# Patient Record
Sex: Female | Born: 1970 | Race: White | Hispanic: No | Marital: Married | Smoking: Former smoker
Health system: Southern US, Community
[De-identification: ages and names within clinical notes are randomized; demographics above are authoritative.]

## PROBLEM LIST (undated history)

## (undated) DIAGNOSIS — F32A Depression, unspecified: Secondary | ICD-10-CM

## (undated) DIAGNOSIS — R51 Headache: Secondary | ICD-10-CM

## (undated) DIAGNOSIS — E039 Hypothyroidism, unspecified: Secondary | ICD-10-CM

## (undated) DIAGNOSIS — R0989 Other specified symptoms and signs involving the circulatory and respiratory systems: Secondary | ICD-10-CM

## (undated) DIAGNOSIS — Z86718 Personal history of other venous thrombosis and embolism: Secondary | ICD-10-CM

## (undated) DIAGNOSIS — J439 Emphysema, unspecified: Secondary | ICD-10-CM

## (undated) DIAGNOSIS — R238 Other skin changes: Secondary | ICD-10-CM

## (undated) DIAGNOSIS — I2510 Atherosclerotic heart disease of native coronary artery without angina pectoris: Secondary | ICD-10-CM

## (undated) DIAGNOSIS — E119 Type 2 diabetes mellitus without complications: Secondary | ICD-10-CM

## (undated) DIAGNOSIS — R519 Headache, unspecified: Secondary | ICD-10-CM

## (undated) DIAGNOSIS — F329 Major depressive disorder, single episode, unspecified: Secondary | ICD-10-CM

## (undated) DIAGNOSIS — R5383 Other fatigue: Secondary | ICD-10-CM

## (undated) DIAGNOSIS — D649 Anemia, unspecified: Secondary | ICD-10-CM

## (undated) DIAGNOSIS — M791 Myalgia, unspecified site: Secondary | ICD-10-CM

## (undated) DIAGNOSIS — R233 Spontaneous ecchymoses: Secondary | ICD-10-CM

## (undated) HISTORY — DX: Spontaneous ecchymoses: R23.3

## (undated) HISTORY — DX: Emphysema, unspecified: J43.9

## (undated) HISTORY — DX: Anemia, unspecified: D64.9

## (undated) HISTORY — DX: Depression, unspecified: F32.A

## (undated) HISTORY — DX: Headache, unspecified: R51.9

## (undated) HISTORY — DX: Myalgia, unspecified site: M79.10

## (undated) HISTORY — DX: Other specified symptoms and signs involving the circulatory and respiratory systems: R09.89

## (undated) HISTORY — DX: Major depressive disorder, single episode, unspecified: F32.9

## (undated) HISTORY — DX: Personal history of other venous thrombosis and embolism: Z86.718

## (undated) HISTORY — DX: Other fatigue: R53.83

## (undated) HISTORY — DX: Other skin changes: R23.8

## (undated) HISTORY — PX: HERNIA REPAIR: SHX51

## (undated) HISTORY — PX: OTHER SURGICAL HISTORY: SHX169

## (undated) HISTORY — DX: Headache: R51

---

## 1996-04-18 HISTORY — PX: ABDOMINAL HYSTERECTOMY: SHX81

## 2004-02-21 ENCOUNTER — Emergency Department: Payer: Self-pay | Admitting: Emergency Medicine

## 2004-02-23 ENCOUNTER — Other Ambulatory Visit: Payer: Self-pay

## 2004-02-23 ENCOUNTER — Emergency Department: Payer: Self-pay | Admitting: Emergency Medicine

## 2004-10-26 ENCOUNTER — Emergency Department: Payer: Self-pay | Admitting: Emergency Medicine

## 2005-03-30 DIAGNOSIS — E039 Hypothyroidism, unspecified: Secondary | ICD-10-CM | POA: Insufficient documentation

## 2005-12-08 DIAGNOSIS — R002 Palpitations: Secondary | ICD-10-CM | POA: Insufficient documentation

## 2006-03-27 DIAGNOSIS — Z720 Tobacco use: Secondary | ICD-10-CM | POA: Insufficient documentation

## 2006-03-27 DIAGNOSIS — R7989 Other specified abnormal findings of blood chemistry: Secondary | ICD-10-CM | POA: Insufficient documentation

## 2007-04-19 HISTORY — PX: DECUBITUS ULCER EXCISION: SHX1443

## 2007-07-25 ENCOUNTER — Ambulatory Visit: Payer: Self-pay | Admitting: Internal Medicine

## 2007-08-24 ENCOUNTER — Ambulatory Visit: Payer: Self-pay | Admitting: Family Medicine

## 2008-01-21 ENCOUNTER — Emergency Department: Payer: Self-pay | Admitting: Emergency Medicine

## 2008-06-26 ENCOUNTER — Emergency Department: Payer: Self-pay | Admitting: Unknown Physician Specialty

## 2008-08-16 ENCOUNTER — Ambulatory Visit: Payer: Self-pay | Admitting: Oncology

## 2008-08-21 ENCOUNTER — Ambulatory Visit: Payer: Self-pay | Admitting: Family Medicine

## 2009-02-23 ENCOUNTER — Emergency Department: Payer: Self-pay | Admitting: Emergency Medicine

## 2009-04-10 DIAGNOSIS — F32A Depression, unspecified: Secondary | ICD-10-CM | POA: Insufficient documentation

## 2009-08-16 ENCOUNTER — Ambulatory Visit: Payer: Self-pay | Admitting: Internal Medicine

## 2009-09-09 ENCOUNTER — Ambulatory Visit: Payer: Self-pay | Admitting: Internal Medicine

## 2009-09-16 ENCOUNTER — Ambulatory Visit: Payer: Self-pay | Admitting: Internal Medicine

## 2009-10-16 ENCOUNTER — Ambulatory Visit: Payer: Self-pay | Admitting: Internal Medicine

## 2011-06-21 ENCOUNTER — Ambulatory Visit: Payer: Self-pay | Admitting: Family Medicine

## 2012-12-18 ENCOUNTER — Inpatient Hospital Stay: Payer: Self-pay | Admitting: Internal Medicine

## 2012-12-18 LAB — COMPREHENSIVE METABOLIC PANEL
BUN: 15 mg/dL (ref 7–18)
Calcium, Total: 8.9 mg/dL (ref 8.5–10.1)
Chloride: 88 mmol/L — ABNORMAL LOW (ref 98–107)
Co2: 34 mmol/L — ABNORMAL HIGH (ref 21–32)
EGFR (African American): >60
Glucose: 676 mg/dL (ref 65–99)
Osmolality: 296 (ref 275–301)
Potassium: 3.3 mmol/L — ABNORMAL LOW (ref 3.5–5.1)
SGOT(AST): 29 U/L (ref 15–37)
SGPT (ALT): 28 U/L (ref 12–78)
Sodium: 131 mmol/L — ABNORMAL LOW (ref 136–145)

## 2012-12-18 LAB — DIFFERENTIAL
Bands: 6 %
Monocytes: 3 %
Segmented Neutrophils: 86 %

## 2012-12-18 LAB — CBC
HCT: 37 % (ref 35.0–47.0)
MCHC: 30.7 g/dL — ABNORMAL LOW (ref 32.0–36.0)
MCV: 67 fL — ABNORMAL LOW (ref 80–100)
Platelet: 90 10*3/uL — ABNORMAL LOW (ref 150–440)
RBC: 5.51 10*6/uL — ABNORMAL HIGH (ref 3.80–5.20)
RDW: 16 % — ABNORMAL HIGH (ref 11.5–14.5)

## 2012-12-18 LAB — URINALYSIS, COMPLETE
Bacteria: NONE SEEN
Bilirubin,UR: NEGATIVE
Glucose,UR: >=500 mg/dL (ref 0–75)
Leukocyte Esterase: NEGATIVE
Protein: 30
WBC UR: <1 /HPF (ref 0–5)

## 2012-12-18 LAB — HEMOGLOBIN A1C: Hemoglobin A1C: 11.8 % — ABNORMAL HIGH (ref 4.2–6.3)

## 2012-12-18 LAB — LIPASE, BLOOD: Lipase: 179 U/L (ref 73–393)

## 2012-12-18 LAB — MAGNESIUM: Magnesium: 1.6 mg/dL — ABNORMAL LOW

## 2012-12-19 LAB — BASIC METABOLIC PANEL
Anion Gap: 7 (ref 7–16)
Calcium, Total: 8.4 mg/dL — ABNORMAL LOW (ref 8.5–10.1)
Chloride: 103 mmol/L (ref 98–107)
Co2: 28 mmol/L (ref 21–32)
Creatinine: 0.4 mg/dL — ABNORMAL LOW (ref 0.60–1.30)
EGFR (African American): >60
EGFR (Non-African Amer.): >60
Glucose: 240 mg/dL — ABNORMAL HIGH (ref 65–99)
Osmolality: 281 (ref 275–301)
Potassium: 3.4 mmol/L — ABNORMAL LOW (ref 3.5–5.1)
Sodium: 138 mmol/L (ref 136–145)

## 2012-12-19 LAB — URINE CULTURE

## 2012-12-19 LAB — CBC WITH DIFFERENTIAL/PLATELET
Basophil #: 0 10*3/uL (ref 0.0–0.1)
Basophil %: 0.3 %
HGB: 10.4 g/dL — ABNORMAL LOW (ref 12.0–16.0)
Lymphocyte %: 17.8 %
MCV: 65 fL — ABNORMAL LOW (ref 80–100)
Monocyte #: 0.2 x10 3/mm (ref 0.2–0.9)
Monocyte %: 4.2 %
Neutrophil #: 2.9 10*3/uL (ref 1.4–6.5)
RBC: 5 10*6/uL (ref 3.80–5.20)
RDW: 16 % — ABNORMAL HIGH (ref 11.5–14.5)
WBC: 3.7 10*3/uL (ref 3.6–11.0)

## 2012-12-19 LAB — MAGNESIUM: Magnesium: 1.4 mg/dL — ABNORMAL LOW

## 2012-12-20 LAB — CBC WITH DIFFERENTIAL/PLATELET
Basophil #: 0 10*3/uL (ref 0.0–0.1)
Eosinophil #: 0 10*3/uL (ref 0.0–0.7)
Lymphocyte #: 1.1 10*3/uL (ref 1.0–3.6)
Lymphocyte %: 34.9 %
MCH: 20.9 pg — ABNORMAL LOW (ref 26.0–34.0)
MCV: 65 fL — ABNORMAL LOW (ref 80–100)
Monocyte %: 7.8 %
Neutrophil %: 56.1 %
Platelet: 64 10*3/uL — ABNORMAL LOW (ref 150–440)
RDW: 16.1 % — ABNORMAL HIGH (ref 11.5–14.5)

## 2012-12-20 LAB — BASIC METABOLIC PANEL
Calcium, Total: 8.3 mg/dL — ABNORMAL LOW (ref 8.5–10.1)
Co2: 28 mmol/L (ref 21–32)
EGFR (African American): >60
Potassium: 4 mmol/L (ref 3.5–5.1)
Sodium: 141 mmol/L (ref 136–145)

## 2012-12-21 LAB — CBC WITH DIFFERENTIAL/PLATELET
HCT: 31.9 % — ABNORMAL LOW (ref 35.0–47.0)
Lymphocyte #: 1.4 10*3/uL (ref 1.0–3.6)
Lymphocyte %: 37.9 %
MCH: 20.6 pg — ABNORMAL LOW (ref 26.0–34.0)
MCV: 65 fL — ABNORMAL LOW (ref 80–100)
Monocyte #: 0.3 x10 3/mm (ref 0.2–0.9)
Monocyte %: 8.1 %
Neutrophil #: 1.9 10*3/uL (ref 1.4–6.5)
Neutrophil %: 51.7 %
RDW: 16.1 % — ABNORMAL HIGH (ref 11.5–14.5)
WBC: 3.7 10*3/uL (ref 3.6–11.0)

## 2012-12-21 LAB — BASIC METABOLIC PANEL
Anion Gap: 4 — ABNORMAL LOW (ref 7–16)
BUN: 7 mg/dL (ref 7–18)
Calcium, Total: 8.2 mg/dL — ABNORMAL LOW (ref 8.5–10.1)
Chloride: 110 mmol/L — ABNORMAL HIGH (ref 98–107)
EGFR (African American): >60
Glucose: 102 mg/dL — ABNORMAL HIGH (ref 65–99)
Osmolality: 281 (ref 275–301)
Potassium: 4.1 mmol/L (ref 3.5–5.1)
Sodium: 142 mmol/L (ref 136–145)

## 2012-12-23 LAB — CULTURE, BLOOD (SINGLE)

## 2013-02-11 ENCOUNTER — Ambulatory Visit: Payer: Self-pay | Admitting: Podiatry

## 2013-03-02 ENCOUNTER — Emergency Department: Payer: Self-pay | Admitting: Emergency Medicine

## 2013-03-03 LAB — PROTIME-INR
INR: 1
Prothrombin Time: 13 secs (ref 11.5–14.7)

## 2013-03-03 LAB — CBC
HCT: 37.3 % (ref 35.0–47.0)
MCH: 20 pg — ABNORMAL LOW (ref 26.0–34.0)
MCHC: 31.3 g/dL — ABNORMAL LOW (ref 32.0–36.0)
MCV: 64 fL — ABNORMAL LOW (ref 80–100)
RBC: 5.83 10*6/uL — ABNORMAL HIGH (ref 3.80–5.20)
RDW: 17 % — ABNORMAL HIGH (ref 11.5–14.5)
WBC: 5.8 10*3/uL (ref 3.6–11.0)

## 2013-05-20 ENCOUNTER — Observation Stay: Payer: Self-pay | Admitting: Student

## 2013-05-20 ENCOUNTER — Telehealth: Payer: Self-pay | Admitting: *Deleted

## 2013-05-20 LAB — CBC WITH DIFFERENTIAL/PLATELET
BASOS ABS: 0 10*3/uL (ref 0.0–0.1)
BASOS PCT: 1 %
EOS ABS: 0.1 10*3/uL (ref 0.0–0.7)
Eosinophil %: 2.5 %
HCT: 34.9 % — ABNORMAL LOW (ref 35.0–47.0)
HGB: 10.8 g/dL — ABNORMAL LOW (ref 12.0–16.0)
LYMPHS ABS: 1.2 10*3/uL (ref 1.0–3.6)
Lymphocyte %: 25.1 %
MCH: 20.7 pg — ABNORMAL LOW (ref 26.0–34.0)
MCHC: 30.9 g/dL — ABNORMAL LOW (ref 32.0–36.0)
MCV: 67 fL — ABNORMAL LOW (ref 80–100)
MONOS PCT: 7.3 %
Monocyte #: 0.3 x10 3/mm (ref 0.2–0.9)
NEUTROS PCT: 64.1 %
Neutrophil #: 3 10*3/uL (ref 1.4–6.5)
PLATELETS: 115 10*3/uL — AB (ref 150–440)
RBC: 5.2 10*6/uL (ref 3.80–5.20)
RDW: 17.9 % — AB (ref 11.5–14.5)
WBC: 4.8 10*3/uL (ref 3.6–11.0)

## 2013-05-20 LAB — COMPREHENSIVE METABOLIC PANEL
ALK PHOS: 143 U/L — AB
ANION GAP: 4 — AB (ref 7–16)
Albumin: 3.3 g/dL — ABNORMAL LOW (ref 3.4–5.0)
BUN: 10 mg/dL (ref 7–18)
Bilirubin,Total: 0.3 mg/dL (ref 0.2–1.0)
CO2: 36 mmol/L — AB (ref 21–32)
CREATININE: 0.92 mg/dL (ref 0.60–1.30)
Calcium, Total: 8.9 mg/dL (ref 8.5–10.1)
Chloride: 92 mmol/L — ABNORMAL LOW (ref 98–107)
EGFR (Non-African Amer.): >60
Glucose: 397 mg/dL — ABNORMAL HIGH (ref 65–99)
Osmolality: 280 (ref 275–301)
Potassium: 3.7 mmol/L (ref 3.5–5.1)
SGOT(AST): 14 U/L — ABNORMAL LOW (ref 15–37)
SGPT (ALT): 16 U/L (ref 12–78)
SODIUM: 132 mmol/L — AB (ref 136–145)
TOTAL PROTEIN: 6.9 g/dL (ref 6.4–8.2)

## 2013-05-20 NOTE — Telephone Encounter (Signed)
Pt called and said her left foot under the great toe, a callus, was infected and had a really bad odor. York SpanielSaid it was open, oozing, and painful and has been going on since Friday 1.30.15. Per dr Al Corpushyatt pt is to go to the emergency room. Pt did not like answer and i had to tell her multiple times per dr Al Corpushyatt go to ER. Pt finally said ok and understood

## 2013-05-25 LAB — CULTURE, BLOOD (SINGLE)

## 2013-05-30 ENCOUNTER — Ambulatory Visit: Payer: Self-pay | Admitting: Podiatry

## 2013-06-04 ENCOUNTER — Ambulatory Visit: Payer: Self-pay | Admitting: Podiatry

## 2013-06-20 ENCOUNTER — Encounter: Payer: Self-pay | Admitting: Podiatry

## 2013-06-21 ENCOUNTER — Ambulatory Visit (INDEPENDENT_AMBULATORY_CARE_PROVIDER_SITE_OTHER): Payer: Medicare PPO | Admitting: Podiatry

## 2013-06-21 ENCOUNTER — Encounter: Payer: Self-pay | Admitting: Podiatry

## 2013-06-21 VITALS — BP 101/70 | HR 110 | Resp 16 | Ht 61.0 in | Wt 134.0 lb

## 2013-06-21 DIAGNOSIS — E1159 Type 2 diabetes mellitus with other circulatory complications: Secondary | ICD-10-CM

## 2013-06-21 DIAGNOSIS — Q828 Other specified congenital malformations of skin: Secondary | ICD-10-CM

## 2013-06-21 DIAGNOSIS — M79609 Pain in unspecified limb: Secondary | ICD-10-CM

## 2013-06-21 DIAGNOSIS — B351 Tinea unguium: Secondary | ICD-10-CM

## 2013-06-21 NOTE — Progress Notes (Signed)
Subjective:     Patient ID: [name] [name], female   DOB: [birth year], 43 y.o.   MRN: [id number]  HPI patient presents stating I have these lesions underneath my first metatarsal to both feet and my nailbeds are thick and gets sore and making hard for me to wear shoe gear   Review of Systems     Objective:   Physical Exam Neurovascular status unchanged with thick painful nail bed 1-5 both feet and severe keratotic lesion sub-first metatarsal both feet and at risk diabetic who is not in good control and has vascular and neurological issues. No proximal edema erythema or drainage was noted    Assessment:     At risk diabetic with severe keratotic lesion sub-one of both feet and nail disease with pain 1-5 both feet    Plan:     Debridement of painful nail bed 1-5 both feet with no iatrogenic bleeding and debridement of lesions on both first metatarsal with some bleeding with application Neosporin and dressings and instructions on soaks and if any redness or swelling to occur to reappoint immediately. Patient will receive permission for diabetic shoes as I do think they'll be of great benefit to her

## 2013-07-29 ENCOUNTER — Emergency Department: Payer: Self-pay | Admitting: Emergency Medicine

## 2013-07-29 LAB — COMPREHENSIVE METABOLIC PANEL
ALK PHOS: 174 U/L — AB
AST: 28 U/L (ref 15–37)
Albumin: 3.3 g/dL — ABNORMAL LOW (ref 3.4–5.0)
Anion Gap: 6 — ABNORMAL LOW (ref 7–16)
BUN: 10 mg/dL (ref 7–18)
Bilirubin,Total: 0.4 mg/dL (ref 0.2–1.0)
CREATININE: 0.64 mg/dL (ref 0.60–1.30)
Calcium, Total: 8.5 mg/dL (ref 8.5–10.1)
Chloride: 101 mmol/L (ref 98–107)
Co2: 32 mmol/L (ref 21–32)
EGFR (African American): >60
GLUCOSE: 257 mg/dL — AB (ref 65–99)
Osmolality: 285 (ref 275–301)
POTASSIUM: 3.6 mmol/L (ref 3.5–5.1)
SGPT (ALT): 31 U/L (ref 12–78)
Sodium: 139 mmol/L (ref 136–145)
TOTAL PROTEIN: 7.1 g/dL (ref 6.4–8.2)

## 2013-07-29 LAB — TROPONIN I

## 2013-07-29 LAB — LIPASE, BLOOD: LIPASE: 230 U/L (ref 73–393)

## 2013-07-30 ENCOUNTER — Ambulatory Visit: Payer: Medicare PPO | Admitting: Podiatry

## 2013-07-30 LAB — CBC WITH DIFFERENTIAL/PLATELET
BASOS ABS: 0 10*3/uL (ref 0.0–0.1)
Basophil %: 0.7 %
Eosinophil #: 0.1 10*3/uL (ref 0.0–0.7)
Eosinophil %: 1.8 %
HCT: 34.4 % — AB (ref 35.0–47.0)
HGB: 10.5 g/dL — AB (ref 12.0–16.0)
Lymphocyte #: 0.6 10*3/uL — ABNORMAL LOW (ref 1.0–3.6)
Lymphocyte %: 16.7 %
MCH: 19.6 pg — ABNORMAL LOW (ref 26.0–34.0)
MCHC: 30.6 g/dL — ABNORMAL LOW (ref 32.0–36.0)
MCV: 64 fL — ABNORMAL LOW (ref 80–100)
Monocyte #: 0.2 x10 3/mm (ref 0.2–0.9)
Monocyte %: 5.7 %
Neutrophil #: 2.7 10*3/uL (ref 1.4–6.5)
Neutrophil %: 75.1 %
Platelet: 94 10*3/uL — ABNORMAL LOW (ref 150–440)
RBC: 5.38 10*6/uL — AB (ref 3.80–5.20)
RDW: 19.4 % — AB (ref 11.5–14.5)
WBC: 3.6 10*3/uL (ref 3.6–11.0)

## 2013-07-30 LAB — URINALYSIS, COMPLETE
Bilirubin,UR: NEGATIVE
KETONE: NEGATIVE
Leukocyte Esterase: NEGATIVE
NITRITE: NEGATIVE
PH: 7 (ref 4.5–8.0)
Protein: 100
RBC,UR: 7 /HPF (ref 0–5)
Specific Gravity: 1.013 (ref 1.003–1.030)
WBC UR: 1 /HPF (ref 0–5)

## 2013-07-30 LAB — AMMONIA: AMMONIA, PLASMA: 14 umol/L (ref 11–32)

## 2013-08-01 LAB — COMPREHENSIVE METABOLIC PANEL
AST: 31 U/L (ref 15–37)
Albumin: 3.4 g/dL (ref 3.4–5.0)
Alkaline Phosphatase: 150 U/L — ABNORMAL HIGH
Anion Gap: 8 (ref 7–16)
BILIRUBIN TOTAL: 0.5 mg/dL (ref 0.2–1.0)
BUN: 20 mg/dL — ABNORMAL HIGH (ref 7–18)
CREATININE: 0.82 mg/dL (ref 0.60–1.30)
Calcium, Total: 8.5 mg/dL (ref 8.5–10.1)
Chloride: 93 mmol/L — ABNORMAL LOW (ref 98–107)
Co2: 28 mmol/L (ref 21–32)
EGFR (African American): >60
EGFR (Non-African Amer.): >60
GLUCOSE: 393 mg/dL — AB (ref 65–99)
Osmolality: 278 (ref 275–301)
Potassium: 3.6 mmol/L (ref 3.5–5.1)
SGPT (ALT): 27 U/L (ref 12–78)
Sodium: 129 mmol/L — ABNORMAL LOW (ref 136–145)
TOTAL PROTEIN: 7.5 g/dL (ref 6.4–8.2)

## 2013-08-01 LAB — CBC WITH DIFFERENTIAL/PLATELET
Basophil #: 0 10*3/uL (ref 0.0–0.1)
Basophil %: 0.5 %
EOS ABS: 0 10*3/uL (ref 0.0–0.7)
EOS PCT: 0 %
HCT: 38.8 % (ref 35.0–47.0)
HGB: 12 g/dL (ref 12.0–16.0)
Lymphocyte #: 0.8 10*3/uL — ABNORMAL LOW (ref 1.0–3.6)
Lymphocyte %: 13.1 %
MCH: 19.4 pg — ABNORMAL LOW (ref 26.0–34.0)
MCHC: 30.9 g/dL — ABNORMAL LOW (ref 32.0–36.0)
MCV: 63 fL — ABNORMAL LOW (ref 80–100)
Monocyte #: 0.3 x10 3/mm (ref 0.2–0.9)
Monocyte %: 5.3 %
NEUTROS ABS: 5 10*3/uL (ref 1.4–6.5)
NEUTROS PCT: 81.1 %
Platelet: 137 10*3/uL — ABNORMAL LOW (ref 150–440)
RBC: 6.18 10*6/uL — ABNORMAL HIGH (ref 3.80–5.20)
RDW: 19.2 % — AB (ref 11.5–14.5)
WBC: 6.2 10*3/uL (ref 3.6–11.0)

## 2013-08-01 LAB — URINALYSIS, COMPLETE
Bacteria: NONE SEEN
Bilirubin,UR: NEGATIVE
Glucose,UR: >=500 mg/dL (ref 0–75)
LEUKOCYTE ESTERASE: NEGATIVE
Nitrite: NEGATIVE
Ph: 6 (ref 4.5–8.0)
Protein: >=500
Specific Gravity: 1.036 (ref 1.003–1.030)
Squamous Epithelial: NONE SEEN
WBC UR: NONE SEEN /HPF (ref 0–5)

## 2013-08-01 LAB — LIPASE, BLOOD: Lipase: 237 U/L (ref 73–393)

## 2013-08-02 LAB — BASIC METABOLIC PANEL
Anion Gap: 6 — ABNORMAL LOW (ref 7–16)
BUN: 11 mg/dL (ref 7–18)
CALCIUM: 8.4 mg/dL — AB (ref 8.5–10.1)
CO2: 30 mmol/L (ref 21–32)
Chloride: 97 mmol/L — ABNORMAL LOW (ref 98–107)
Creatinine: 0.71 mg/dL (ref 0.60–1.30)
EGFR (African American): >60
EGFR (Non-African Amer.): >60
Glucose: 202 mg/dL — ABNORMAL HIGH (ref 65–99)
Osmolality: 272 (ref 275–301)
Potassium: 3.3 mmol/L — ABNORMAL LOW (ref 3.5–5.1)
SODIUM: 133 mmol/L — AB (ref 136–145)

## 2013-08-02 LAB — CBC WITH DIFFERENTIAL/PLATELET
BASOS PCT: 0.4 %
Basophil #: 0 10*3/uL (ref 0.0–0.1)
EOS ABS: 0 10*3/uL (ref 0.0–0.7)
EOS PCT: 0.1 %
HCT: 34.8 % — AB (ref 35.0–47.0)
HGB: 11 g/dL — ABNORMAL LOW (ref 12.0–16.0)
LYMPHS ABS: 0.8 10*3/uL — AB (ref 1.0–3.6)
LYMPHS PCT: 18.3 %
MCH: 19.7 pg — ABNORMAL LOW (ref 26.0–34.0)
MCHC: 31.5 g/dL — ABNORMAL LOW (ref 32.0–36.0)
MCV: 62 fL — AB (ref 80–100)
Monocyte #: 0.4 x10 3/mm (ref 0.2–0.9)
Monocyte %: 7.9 %
NEUTROS ABS: 3.3 10*3/uL (ref 1.4–6.5)
Neutrophil %: 73.3 %
Platelet: 118 10*3/uL — ABNORMAL LOW (ref 150–440)
RBC: 5.58 10*6/uL — ABNORMAL HIGH (ref 3.80–5.20)
RDW: 19.2 % — ABNORMAL HIGH (ref 11.5–14.5)
WBC: 4.6 10*3/uL (ref 3.6–11.0)

## 2013-08-02 LAB — LIPID PANEL
CHOLESTEROL: 290 mg/dL — AB (ref 0–200)
HDL Cholesterol: 33 mg/dL — ABNORMAL LOW (ref 40–60)
LDL CHOLESTEROL, CALC: 205 mg/dL — AB (ref 0–100)
Triglycerides: 258 mg/dL — ABNORMAL HIGH (ref 0–200)
VLDL Cholesterol, Calc: 52 mg/dL — ABNORMAL HIGH (ref 5–40)

## 2013-08-02 LAB — TSH: THYROID STIMULATING HORM: 50.3 u[IU]/mL — AB

## 2013-08-03 LAB — BASIC METABOLIC PANEL
ANION GAP: 6 — AB (ref 7–16)
BUN: 9 mg/dL (ref 7–18)
CREATININE: 0.73 mg/dL (ref 0.60–1.30)
Calcium, Total: 8 mg/dL — ABNORMAL LOW (ref 8.5–10.1)
Chloride: 102 mmol/L (ref 98–107)
Co2: 28 mmol/L (ref 21–32)
EGFR (Non-African Amer.): >60
Glucose: 136 mg/dL — ABNORMAL HIGH (ref 65–99)
Osmolality: 273 (ref 275–301)
Potassium: 3.8 mmol/L (ref 3.5–5.1)
SODIUM: 136 mmol/L (ref 136–145)

## 2013-08-04 ENCOUNTER — Inpatient Hospital Stay: Payer: Self-pay | Admitting: Internal Medicine

## 2013-08-07 LAB — PATHOLOGY REPORT

## 2013-10-04 ENCOUNTER — Ambulatory Visit: Payer: Self-pay | Admitting: Family Medicine

## 2013-11-05 ENCOUNTER — Ambulatory Visit (INDEPENDENT_AMBULATORY_CARE_PROVIDER_SITE_OTHER): Payer: Medicare PPO | Admitting: Podiatry

## 2013-11-05 VITALS — BP 107/74 | HR 108 | Resp 16

## 2013-11-05 DIAGNOSIS — Q828 Other specified congenital malformations of skin: Secondary | ICD-10-CM

## 2013-11-05 DIAGNOSIS — B351 Tinea unguium: Secondary | ICD-10-CM

## 2013-11-05 DIAGNOSIS — E1159 Type 2 diabetes mellitus with other circulatory complications: Secondary | ICD-10-CM

## 2013-11-05 DIAGNOSIS — M79609 Pain in unspecified limb: Secondary | ICD-10-CM

## 2013-11-05 DIAGNOSIS — M79673 Pain in unspecified foot: Secondary | ICD-10-CM

## 2013-11-05 NOTE — Progress Notes (Signed)
Subjective:     Patient ID: [name] [name], female   DOB: [birth year], 43 y.o.   MRN: [id number]  HPI patient is long-term diabetic with severe lesion underneath the first metatarsal both feet and nail disease with thickness and elongation 1-5 of both feet.   Review of Systems     Objective:   Physical Exam Neurovascular status unchanged with diminishment of sharp tall and vibratory and severe keratotic lesions underneath the right and left first metatarsal with dried blood formation and   elongated nailbeds that are thick yellow and brittle 1-5 of both feet that are painful Assessment:     At risk diabetic with severe keratotic lesions plantar aspect of right foot and left foot and nail disease 1-5 both feet    Plan:     Debrided lesions on both feet and debridement nailbeds 1-5 both feet with no iatrogenic bleeding noted. Discussed long-term diabetic shoes and will get her approved for these due to her at risk situation

## 2013-11-26 ENCOUNTER — Telehealth: Payer: Self-pay | Admitting: *Deleted

## 2013-11-26 NOTE — Telephone Encounter (Signed)
CALLED AND SPOKE WITH PTS HUSBAND LETTING HIM KNOW WE NEED TO SCH PT TO COME IN AND BE MEASURED FOR DIABETIC SHOES. STATES HE WILL CALL VIVIANA AND HAVE HER TO CALL BACK TO SCHEDULE APPT.

## 2013-12-13 ENCOUNTER — Telehealth: Payer: Self-pay | Admitting: *Deleted

## 2013-12-13 ENCOUNTER — Ambulatory Visit (INDEPENDENT_AMBULATORY_CARE_PROVIDER_SITE_OTHER): Payer: Medicare PPO | Admitting: Podiatry

## 2013-12-13 VITALS — BP 133/79 | HR 100 | Resp 16

## 2013-12-13 DIAGNOSIS — M79673 Pain in unspecified foot: Secondary | ICD-10-CM

## 2013-12-13 DIAGNOSIS — M79609 Pain in unspecified limb: Secondary | ICD-10-CM

## 2013-12-13 DIAGNOSIS — Q828 Other specified congenital malformations of skin: Secondary | ICD-10-CM

## 2013-12-13 DIAGNOSIS — E1159 Type 2 diabetes mellitus with other circulatory complications: Secondary | ICD-10-CM

## 2013-12-13 NOTE — Progress Notes (Signed)
Subjective:     Patient ID: [name] [name], female   DOB: 09/18/1970, 43 y.o.   MRN: [id number]  HPI patient presents with husband in very poor health with large thick keratotic lesions underneath the first metatarsal head of both feet which can become very sore. She is a long-term diabetic and is in poor control and has numerous health problems   Review of Systems     Objective:   Physical Exam Neurovascular status unchanged with thick type tissue underneath the first metatarsal head that grew back very quickly with obvious signs of bleeding which occurs underneath the lesions    Assessment:     Very difficult to determine what type of lesions are present but concerning that it becomes so thick and damage    Plan:     I anesthetized the left forefoot debris did tissue and then did a 3 mm punch biopsy that I send off for pathology to try to understand better the tissue. Debris did tissue on both feet and applied sterile dressings and then went ahead and measured for diabetic shoes to try to reduce pressure against her feet

## 2013-12-13 NOTE — Telephone Encounter (Signed)
CALLED AND LEFT MESSAGE STATING [employer] NO LONGER HAD THE SHOE SHE HAD PICKED OUT STRETCHABLE [employer] #[id number]. THEY DO HAVE THE SAME SHOE IN BLACK ORDER [phone]. ASKED PT TO CALL ME BACK TO LET ME KNOW IF SHE WANTS THE SHOE OR COME BACK IN TO PICK OUT A DIFFERENT SHOE.

## 2013-12-20 ENCOUNTER — Encounter: Payer: Self-pay | Admitting: Podiatry

## 2013-12-25 ENCOUNTER — Telehealth: Payer: Self-pay | Admitting: *Deleted

## 2013-12-25 NOTE — Telephone Encounter (Signed)
Called and left message asking pt if she would call back to see if she would like the black pair of shoes since they no longer carry tan color

## 2013-12-26 ENCOUNTER — Ambulatory Visit (INDEPENDENT_AMBULATORY_CARE_PROVIDER_SITE_OTHER): Payer: Medicare PPO | Admitting: Podiatry

## 2013-12-26 VITALS — BP 99/61 | HR 105 | Resp 16

## 2013-12-26 DIAGNOSIS — Q828 Other specified congenital malformations of skin: Secondary | ICD-10-CM

## 2013-12-26 DIAGNOSIS — M79609 Pain in unspecified limb: Secondary | ICD-10-CM

## 2013-12-26 DIAGNOSIS — L97509 Non-pressure chronic ulcer of other part of unspecified foot with unspecified severity: Secondary | ICD-10-CM

## 2013-12-26 DIAGNOSIS — M79673 Pain in unspecified foot: Secondary | ICD-10-CM

## 2013-12-26 MED ORDER — DOXYCYCLINE HYCLATE 100 MG PO TABS: 100.0000 mg | ORAL_TABLET | Freq: Two times a day (BID) | ORAL | Status: DC

## 2013-12-26 MED ORDER — SILVER SULFADIAZINE 1 % EX CREA: TOPICAL_CREAM | CUTANEOUS | Status: DC

## 2013-12-26 NOTE — Patient Instructions (Signed)
Monitor for any signs/symptoms of infection. Call the office immediately if any occur or go directly to the emergency room. Call with any questions/concerns. Continue daily dressing changes 

## 2013-12-27 ENCOUNTER — Telehealth: Payer: Self-pay | Admitting: *Deleted

## 2013-12-27 NOTE — Telephone Encounter (Signed)
Called and spoke with pts husband letting him know [name] [name] has an appt with [facility] on 9.16.15 at 9:30 am. Pts husband understood.

## 2013-12-28 NOTE — Progress Notes (Signed)
Patient ID: [name] [name], female   DOB: April 07, 1971, 43 y.o.   MRN: [id number]  Subjective: [name] [name] returns the office they with her husband for followup evaluation of bilateral foot wounds. She states that since her last appointment she believes that the wounds have been getting worse. She has since noticed a malodor coming from her feet in the are sore. They have been continuing with daily dressing changes. She previously had had some nausea but currently she denies any systemic complaints such as fevers, chills, nausea, vomiting. She states that her feet feel chronically cool. This is not an acute change. Denies any other complaints at this time.  Objective: AAO x3, NAD DP/PT pulses palpable. CRT < 3sec Decreased protective sensation with Dorann Ou monofilament. Left foot: Submetatarsal one with very thick hyperkeratotic tissue with underlying macerated tissue and fibro-granular ulceration. There is no surrounding erythema, ascending cellulitis, warmth there is no probing to bone, undermining, tunneling. No purulence Hemorrhagic area on the lateral aspect of the foot with no ulceration. Right foot: Very thick submetatarsal 1 hyperkeratotic tissue  with underlying macerated tissue. There is an underlying fibro-granular wound which does not probe, undermining or tunneling. No purulence.There is epiduralysis and superficial ulceration along the dorsal medial aspect of the foot. Hemorrhagic area on the lateral aspect of the foot with no open lesion but appears to be slightly bulla starting to form. There is mild edema and a slight warmth to the right foot. There is no ascending cellulitis. There is no fluctuance or crepitus.  No leg pain, swelling, warmth.  Assessment: 43 year old female with bilateral foot ulceration with possible mild infection to the right foot.  Plan: -Surgical versus conservative treatment discussed with the patient and her family in detail including  alternatives, risks, complications. -Bilateral submetatarsal wounds sharply debrided without complications of hyperkeratotic tissue.  -Ordered ABIs and PVRs -Silvadene to the wounds with daily dressing changes. Discussed the importance of not applying the dressing too tight as that may be contributing to the hemorrhagic areas on the lateral aspect of the feet bilaterally. -Prescribed doxycycline 100 mg twice a day. -Biopsy results from her last appointment were discussed with the patient. Discussed that we can do a deeper biopsy. They are to hold off at this time. We'll reconsider in the near future if wounds do not heal.  -Followup in 1 week or sooner if any problems are to arise or any change in symptoms. Discussed that if there is increased warmth, drainage, malodor or any other clinical signs or symptoms of infection she is directed to go immediately to the emergency room.

## 2014-01-02 ENCOUNTER — Telehealth: Payer: Self-pay | Admitting: *Deleted

## 2014-01-02 ENCOUNTER — Ambulatory Visit (INDEPENDENT_AMBULATORY_CARE_PROVIDER_SITE_OTHER): Payer: Medicare PPO

## 2014-01-02 ENCOUNTER — Ambulatory Visit (INDEPENDENT_AMBULATORY_CARE_PROVIDER_SITE_OTHER): Payer: Medicare PPO | Admitting: Podiatry

## 2014-01-02 VITALS — BP 110/79 | HR 105 | Resp 16

## 2014-01-02 DIAGNOSIS — L97509 Non-pressure chronic ulcer of other part of unspecified foot with unspecified severity: Secondary | ICD-10-CM

## 2014-01-02 DIAGNOSIS — M79673 Pain in unspecified foot: Secondary | ICD-10-CM

## 2014-01-02 MED ORDER — SILVER SULFADIAZINE 1 % EX CREA: TOPICAL_CREAM | CUTANEOUS | Status: DC

## 2014-01-02 NOTE — Telephone Encounter (Signed)
Called and spoke with pts husband letting him know appt with [facility] is sch 10.5.15 at 10:00 with dr schnier. pts husband understood.

## 2014-01-02 NOTE — Patient Instructions (Signed)
Continue with daily dressing changes.  We will put in a referral for vascular surgery to evaluate your blood flow. Monitor for any signs/symptoms of infection. Call the office immediately if any occur or go directly to the emergency room. Call with any questions/concerns.

## 2014-01-03 NOTE — Progress Notes (Signed)
Patient ID: [name] [name], female   DOB: [birth year], 43 y.o.   MRN: [id number]  Subjective: [name] [name], 43 year old female, presents the office today with her husband for followup evaluation of bilateral foot wounds. She has not started taking antibiotic as directed she was concerned about possible interactions with her other medications. States that the malodor has decreased over the wounds persists. He been applying Silvadene and a daily dressing. She states that she went to have the vascular studies done on Wednesday and she was told that she had decreased blood flow to her left side and that she may need for further study (I have not yet received the results). Currently she denies any nausea, vomiting, fevers, chills.   Objective: AAO x3, NAD Neurovascular status unchanged. Left foot: Submetatarsal 1 hyperkeratotic lesion with underlying macerated tissue although decreased from last week. There is a fibro-granular underlying ulceration. There is a black discoloration to the hyperkerotic tissue, likely from dried blood. There is no probing, an undermining, tunneling. No malodor or purulence.  Right foot: Submetatarsal 1 hyperkerotic lesion with some underlying macerated tissue with the callus area soft and partially disconnected from the underlying skin. Upon debridement, Small fibro-granular ulcerations. There is also a superficial, granular ulceration along the dorsal medial aspect of the forefoot. No probing, undermining, tunneling. No purulence, malodor. There is mild erythema around both wounds, however no ascending cellulitis, no fluctuance or crepitance. Flaccid  hemorrhagic bulla on the lateral aspect of the foot. No surrounding erythema, ascending cellulitis.  No leg pain, warmth, swelling.  Assessment: 43 year old female with bilateral foot ulceration, mild erythema to right foot.  Plan: -X-rays were obtained and reviewed with the patient. See x-ray report for full details.   -Conservative versus surgical treatment were discussed including alternatives, risks, complications. -Hyperkeratotic lesions/wound sharply debrided bilateral feet without complications. 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 -Continue with daily dressing changes with Silvadene. -Start taking the doxycycline as prescribed last week. [place] the patients pharmacy to verify that there is no interactions with her other medications. -Will order home nursing supplies to be sent the house -Followup in one week for further debridement. If wounds do not appear to be making any progress we will refer the patient to the wound care center. In the meantime call any questions or concerns or change in symptoms.

## 2014-01-09 ENCOUNTER — Ambulatory Visit (INDEPENDENT_AMBULATORY_CARE_PROVIDER_SITE_OTHER): Payer: Medicare PPO | Admitting: Podiatry

## 2014-01-09 VITALS — BP 106/72 | HR 103 | Resp 16

## 2014-01-09 DIAGNOSIS — L97509 Non-pressure chronic ulcer of other part of unspecified foot with unspecified severity: Secondary | ICD-10-CM

## 2014-01-09 DIAGNOSIS — M79673 Pain in unspecified foot: Secondary | ICD-10-CM

## 2014-01-09 NOTE — Patient Instructions (Signed)
Continue antibiotic. Monitor for any signs/symptoms of infection. Call the office immediately if any occur or go directly to the emergency room. Call with any questions/concerns. Follow up with vascular surgery and the wound care center.

## 2014-01-10 ENCOUNTER — Telehealth: Payer: Self-pay | Admitting: *Deleted

## 2014-01-10 DIAGNOSIS — L97509 Non-pressure chronic ulcer of other part of unspecified foot with unspecified severity: Secondary | ICD-10-CM | POA: Insufficient documentation

## 2014-01-10 NOTE — Progress Notes (Signed)
Patient ID: [name] [name], female   DOB: 06-01-1970, 43 y.o.   MRN: [id number]  Subjective: [name] [name], 43 year old female, returns to the office today with her husband for followup evaluation of bilateral foot wounds. She states that she started taking antibiotic. She denies any systemic complaints as fevers, chills, nausea, vomiting. She states that the malodor has resolved. She's been continuing with the Silvadene daily dressing changes. She has an appointment to see vascular surgery in approximately one week. Denies any acute changes since last appointment. No other complaints at this time.  Objective: AAO x3, NAD DP/PT pulses palpable b/l. CRT < 3sec Decreased protective sensation with Dorann Ou monofilament. Left foot: Submetatarsal 1 hyperkeratotic lesion with over this slight underlying macerated. Hyperkeratotic tissue appears to be less compared to last week. There is evidence of dried blood within the hyperkeratotic tissue. Upon debridement there is a superficial small ulceration without any probing/undermining/tunneling. There is no swelling erythema, ascending cellulitis, drainage, purulence. Dried hemorrhagic bulla on the lateral aspect of the foot without any evidence of fluctuance, crepitus, surrounding erythema. Right foot: Hyperkeratotic lesion submetatarsal one although significantly decreased compared to last week. Medial first MTPJ approximately 3 x 2 cm granular ulceration which appears to be deeper compared to last week. Probes close the capsule however capsules not expose. There is sanguinous drainage from the site. Superficial ulceration on the dorsal first metatarsal approximately 2 x 2 with granular wound base. There is decreased erythema compared to last week. There is no increase in warmth at this time. No evidence of fluctuance or crepitus. No ascending cellulitis. Lateral aspect of the foot hyperkeratotic lesion from dried hemorrhagic bulla which appears to be  partially removed. No swelling erythema, drainage. Submetatarsal 5 hyperkeratotic lesion. No calf pain with compression, swelling, warmth.  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  Plan: -Conservative versus surgical treatment were discussed including alternatives, risks, complications. -Bilateral wounds are sharply debrided to healthy tissue. -Continue with daily dressing changes with Silvadene. -A prescription for dressing supplies was sent to [facility].  -Discussed that if these are chronic wounds and the one wound on the right side and appears to be worsening I recommend patient be evaluated by the wound care center. I discussed with the patient and her husband at great length again today the importance of trying to get these wounds to heal in appropriate time to avoid any limb loss. At this appointment they again asked about the results of the biopsy. I continuously have explained to them over the last 3 weeks the results of the biopsy and that we can obtain a deeper biopsy if needed. The patient again states that she does not want another biopsy. -Followup in one week or sooner if any problmes are to arise. In the meantime call any questions, concerns. I discussed that if they get an appointment with the wound care center to be evaluated they do not need to see me unless they do not want to go there for treatment. They were hesitant on going but I explained in great detail they may be able to offer treatment that we cannot provide. No longer these wounds go open the higher chance of infection/limb loss. Also followup with vascular surgery.

## 2014-01-10 NOTE — Telephone Encounter (Signed)
Called and left message letting her knowleft submet 2 was mild drainage and it is bloody per dr Ardelle Anton.

## 2014-01-13 ENCOUNTER — Encounter: Payer: Self-pay | Admitting: Surgery

## 2014-01-16 ENCOUNTER — Encounter: Payer: Self-pay | Admitting: *Deleted

## 2014-01-16 ENCOUNTER — Encounter: Payer: Self-pay | Admitting: Surgery

## 2014-01-16 ENCOUNTER — Ambulatory Visit: Payer: Medicare PPO | Admitting: Podiatry

## 2014-01-16 NOTE — Progress Notes (Unsigned)
Called pts ins co. Went thru automated system twice. Gave them procedure codes [id number] and [phone] (given to me by sheila [facility]) and gave cpt 707.15. Ins said no prior auth needed.

## 2014-01-23 ENCOUNTER — Ambulatory Visit: Payer: Self-pay | Admitting: Surgery

## 2014-01-30 ENCOUNTER — Encounter: Payer: Self-pay | Admitting: Podiatry

## 2014-01-31 ENCOUNTER — Ambulatory Visit: Payer: Self-pay | Admitting: Surgery

## 2014-02-04 ENCOUNTER — Telehealth: Payer: Self-pay | Admitting: Podiatry

## 2014-02-04 NOTE — Telephone Encounter (Signed)
Diabetic shoes are in. Left a message at both phone numbers to schedule an appointment with Dr. Ardelle AntonWagoner.

## 2014-02-16 ENCOUNTER — Encounter: Payer: Self-pay | Admitting: Surgery

## 2014-02-19 ENCOUNTER — Ambulatory Visit: Payer: Self-pay | Admitting: Surgery

## 2014-03-18 ENCOUNTER — Encounter: Payer: Self-pay | Admitting: Surgery

## 2014-03-28 ENCOUNTER — Ambulatory Visit: Payer: Self-pay | Admitting: Ophthalmology

## 2014-03-28 LAB — CBC WITH DIFFERENTIAL/PLATELET
BASOS ABS: 0 10*3/uL (ref 0.0–0.1)
Basophil %: 1.1 %
EOS ABS: 0.1 10*3/uL (ref 0.0–0.7)
Eosinophil %: 2.9 %
HCT: 38.9 % (ref 35.0–47.0)
HGB: 11.5 g/dL — ABNORMAL LOW (ref 12.0–16.0)
Lymphocyte #: 1.2 10*3/uL (ref 1.0–3.6)
Lymphocyte %: 30.9 %
MCH: 19 pg — ABNORMAL LOW (ref 26.0–34.0)
MCHC: 29.5 g/dL — ABNORMAL LOW (ref 32.0–36.0)
MCV: 64 fL — ABNORMAL LOW (ref 80–100)
MONO ABS: 0.3 x10 3/mm (ref 0.2–0.9)
Monocyte %: 7.9 %
NEUTROS ABS: 2.2 10*3/uL (ref 1.4–6.5)
Neutrophil %: 57.2 %
Platelet: 104 10*3/uL — ABNORMAL LOW (ref 150–440)
RBC: 6.05 10*6/uL — AB (ref 3.80–5.20)
RDW: 19.8 % — AB (ref 11.5–14.5)
WBC: 3.9 10*3/uL (ref 3.6–11.0)

## 2014-03-28 LAB — POTASSIUM: Potassium: 3.6 mmol/L (ref 3.5–5.1)

## 2014-03-29 LAB — WOUND AEROBIC CULTURE

## 2014-04-03 ENCOUNTER — Ambulatory Visit (INDEPENDENT_AMBULATORY_CARE_PROVIDER_SITE_OTHER): Payer: Medicare PPO | Admitting: Podiatry

## 2014-04-03 DIAGNOSIS — M79676 Pain in unspecified toe(s): Secondary | ICD-10-CM

## 2014-04-03 DIAGNOSIS — L03012 Cellulitis of left finger: Secondary | ICD-10-CM

## 2014-04-03 DIAGNOSIS — B351 Tinea unguium: Secondary | ICD-10-CM

## 2014-04-03 NOTE — Patient Instructions (Addendum)
Diabetic Shoe and Insoles Instructions   Congratulations on receiving your new shoes and insoles. In accordance with Medicare regulations, they have been selected from our own inventory or have been special ordered to provide you with the optimum comfort and protection. In order to receive the greatest benefit from this footwear, please follow these suggested guidelines.   Initial use of your diabetic shoes It may take a couple of days for you to feel fully comfortable wearing your new diabetic shoes and insoles. Some people are immediately comfortable wearing them, while others need more time to adjust. Generally, the body does not adapt to change rapidly and you may experience mild aches or fatigue when you first begin to wear your shoes.  Wear these shoes as long as they are comfortable. Try to only wear them indoors until you feel that they are comfortable for outdoor use.  If they bother your feet, TAKE THEM OFF and try them again a few hours later or the next day. Check for any soreness or swelling and notify your doctor immediately if you notice any of these signs. Otherwise, increase wear time as they become more comfortable. If you feel like your shoes are bothersome still after trying these tips, please call our office. Keep in mind, that once the outer sole of the shoe becomes dirty, we can no longer send them back.  The insoles should be changed every 4 months and replaced with a new pair.   Maintenance of your new shoes Your new diabetic shoes can be washed with mild soap and water and air dried. Try to keep away from high heat sources (heaters, dryers, fireplaces, etc.) as this may damage or distort the plastazote lining and insert in the shoe. Do NOT machine wash or use harsh chemicals such as bleach on your shoes. These shoes, if properly taken care of, should last one year.   Continuing Care Diabetic patients have fewer foot complications if they have been properly fitted in the  correct type of footwear and accommodating insoles. The desired outcome is to fit you with the most appropriate, best fitting shoe possible in order to reduce the risk of and prevent foot complications, which could ultimately lead to ulceration, infection and amputation. Medicare will pay for one pair of extra depth shoes and 3 pairs of insoles per calendar year.   REMEMBER TO SEE YOUR PODIATRIST FOR REGULAR FOOT EXAMS AT LEAST ONCE PER YEAR.   ANTIBACTERIAL SOAP INSTRUCTIONS  THE DAY AFTER PROCEDURE  Please follow the instructions your doctor has marked.   Shower as usual. Before getting out, place a drop of antibacterial liquid soap (Dial) on a wet, clean washcloth.  Gently wipe washcloth over affected area.  Afterward, rinse the area with warm water.  Blot the area dry with a soft cloth and cover with antibiotic ointment (neosporin, polysporin, bacitracin) and band aid or gauze and tape  Place 3-4 drops of antibacterial liquid soap in a quart of warm tap water.  Submerge foot into water for 20 minutes.  If bandage was applied after your procedure, leave on to allow for easy lift off, then remove and continue with soak for the remaining time.  Next, blot area dry with a soft cloth and cover with a bandage.  Apply other medications as directed by your doctor, such as cortisporin otic solution (eardrops) or neosporin antibiotic ointment

## 2014-04-03 NOTE — Progress Notes (Signed)
[name] [name] ID: [name] [name], female   DOB: 1970-08-18, 43 y.o.   MRN: [id number]  Subjective: 43 year old female presents the office today for painful elongated toenails. The [name] [name] is currently under the care of the wound care center for bilateral plantar foot ulcerations. She states that she finally was approved for hyperbaric oxygen treatment. She states that she changes the dressings every other day at home with a dry dressing as directed by the wound care center. She follows up with the wound care center every Monday. She states the nails have become very elongated and are painful particularly with shoe gear. [name] [name] also presents for diabetic shoe pickup. She denies any recent fevers, chills, nausea, vomiting. She does state that she has started a new antibiotic today as prescribed by the wound care center. No other complaints at this time.  Objective: AAO 3, NAD DP/PT pulses palpable bilaterally, CRT less than 3 seconds Protective sensation decreased with Simms Weinstein monofilament There are plantar hyperkeratotic lesion submetatarsal one without any signs of infection. There is currently no evidence of open lesions. There is no surrounding area edema, edema, increase in warmth, drainage, purulence. Nails are severely elongated, hypertrophic, dystrophic, brittle, discolored. On the left hallux there is erythema around the nail extending to the level of the IPJ. The left hallux nail is loose and the underlying nail bed and appears to only be adhered along the cuticle. There is purulence expressed from around the nail. Remaining nails are without any surrounding erythema, edema or other signs of infection. Is no pain with calf compression, swelling, warmth, erythema. No areas of pinpoint bony tenderness or pain with vibratory sensation. No overlying edema, erythema, increase in warmth to bilateral lower extremity.  Assessment: 43 year old female with symptomatic onychomycosis and  paronychia left hallux nail ; bilateral submetatarsal 1 ulcerations  Plan: -Treatment options were discussed with the [name] [name] including alternatives, risks, complications. -Will defer treatment of the bilateral foot wounds to the wound care center as they are currently treating this. -Nails are sharply debrided without complications to [name] [name] comfort. -Upon evaluation today there is infection of the left hallux toenail and there is erythema is a leveled IPJ of the left hallux. The nail is loose and is only loosely adhered proximally. There was purulence expressed upon palpation. The nail sharply debrided which revealed the nail was loose and there was a granular wound already formed underneath the toenail. At this time due to infection recommended total nail avulsion to limit the area drain as the toenail is currently not adhered and is a vehicle for infection. Risks and competitions of the procedure were discussed the [name] [name] and [name] [name] who is present. Verbal consent was obtained. Under sterile conditions a total of 2 mL of a one-to-one mixture of 2% lidocaine plain and 0.5% Marcaine plain was infiltrated in a hallux block fashion on the left hallux. The area was then prepped in sterile fashion. The left hallux nail was then removed in total. It was found the nail was only very loosely adhered. After removal with nail bed underlying skin was intact and the area was debrided. Area was irrigated and no further purulence was identified. Silvadene was applied followed by dry sterile dressing. At the conclusion of the procedure the digit had an immediate capillary refill time to the digit. No tourniquet was used for the procedure. Post procedure instructions were discussed with the [name] [name]. Since she cannot soak her foot due to the other wounds recommended continue to wash the toenail site with antibacterial  soap daily and keep it covered with interbody ointment and a Band-Aid. Recommended the [name] [name] follow-up  in 1 week or sooner if any problems are to arise. Continue to monitor for any clinical signs or symptoms of infection and directed to call the office immediately should any occur or go to the emergency room. Also continue to follow up with the wound care center.

## 2014-04-15 ENCOUNTER — Ambulatory Visit: Payer: Medicare PPO | Admitting: Podiatry

## 2014-04-18 ENCOUNTER — Encounter: Payer: Self-pay | Admitting: Surgery

## 2014-05-01 ENCOUNTER — Encounter: Payer: Self-pay | Admitting: Surgery

## 2014-05-19 ENCOUNTER — Encounter: Payer: Self-pay | Admitting: Surgery

## 2014-05-19 LAB — WOUND AEROBIC CULTURE

## 2014-06-17 ENCOUNTER — Encounter
Admit: 2014-06-17 | Disposition: A | Discharge status: Home / Self Care | Payer: Self-pay | Attending: Surgery | Admitting: Surgery

## 2014-07-18 ENCOUNTER — Encounter
Admit: 2014-07-18 | Disposition: A | Discharge status: Home / Self Care | Payer: Self-pay | Attending: Surgery | Admitting: Surgery

## 2014-08-08 NOTE — H&P (Signed)
PATIENT NAME:  MARQUETTA, [name] [name] MR#:  [id number] DATE OF BIRTH:  Aug 29, 1970  DATE OF ADMISSION:  12/18/2012  PRIMARY CARE PHYSICIAN:  Leo Grosser, MD  CHIEF COMPLAINT: Nausea, vomiting, abdominal pain and diarrhea since last night.   HISTORY OF PRESENT ILLNESS: This is a 44 year old female with history of chronic pain DVT, pancytopenia, diabetes, COPD and hypothyroidism. She presents with nausea, vomiting and abdominal pain since last night, some diarrhea. Vomiting too many times to count. Abdominal pain feeling like a bowling ball on her abdomen all over, 9 out of 10 in intensity. Nothing makes it better or worse. She has also had diarrhea 4 times. No blood in the vomit or diarrhea. She has not had anything like this before. Nobody else at home is sick. Occasional poor appetite. She does have some weight loss. The patient does not take her insulin for diabetes. In the ER, a chest x-ray showed a mild infiltrate of the left midlung. A CT scan of the abdomen and pelvis showed constipation, large amount of air and fluid in the stomach, correlate evidence for gastric outlet obstruction. Also sugar found to be elevated at 676, not in diabetic ketoacidosis. Hospitalist services were contacted for further evaluation.   PAST MEDICAL HISTORY: COPD, end-stage with chronic respiratory failure on chronic oxygen, hypothyroidism, diabetes, calluses bilateral feet, history of DVT, chronic leg pain and pancytopenia.   PAST SURGICAL HISTORY: In 2003 had a surgery on the groin which was complicated with a femoral artery tear and required urgent repair and that was complicated again with an infection that had to be operated on again. She had internal bleeding, 3 filters for DVT, hysterectomy and melanoma of the shoulder.   ALLERGIES: REGLAN, LOVENOX AND IBUPROFEN.   MEDICATIONS: As per prescription writer include Cymbalta 60 mg daily, gabapentin 100 mg 3 capsules at night, hydrochlorothiazide/triamterene  25/37.5, 1 tablet bedtime; hydromorphone 4 mg twice a day, hydroxyzine, hydrochlorothiazide 25 two tablets daily, levothyroxine 150 mcg daily, lorazepam 0.5 mg 2 tablets at bedtime, methadone 80 mg daily, metoprolol 100 mg at bedtime, nortriptyline 50 mg at bedtime, promethazine 25 mg daily, vitamin D2 at 50,000 units once a week, Vivelle-Dot 0.1 mg per 24 hours twice weekly film once or twice a week, Ambien 10 mg at bedtime.   SOCIAL HISTORY: Quit smoking in 2003. No alcohol. No drug use. Is on disability. Lives with family.   FAMILY HISTORY: Father died at 60 after a surgical procedure. Her mother is living with diabetes.   REVIEW OF SYSTEMS CONSTITUTIONAL: Positive for chills. No fever. No sweats. Positive weight loss over time. Positive for fatigue.  EYES: She is almost blind. They want to do surgery on her eyes but she is trying to hold off.  EARS, NOSE, MOUTH AND THROAT: Positive for some fluid coming out of the nose occasionally. No sore throat. No difficulty swallowing.  CARDIOVASCULAR: Positive for chest pain with vomiting.  RESPIRATORY: Positive for shortness of breath. No cough. No sputum. No hemoptysis.  GASTROINTESTINAL: Positive for nausea. Positive for vomiting. Positive for abdominal pain. Positive for diarrhea. No bright red blood per rectum. No melena. No hematemesis.  GENITOURINARY: No burning on urination or hematuria.  MUSCULOSKELETAL: Chronic leg pain.  INTEGUMENTARY: Positive for itching on her back.  PSYCHIATRIC: Positive for depression.  ENDOCRINE: Positive for hypothyroidism.  HEMATOLOGIC AND LYMPHATIC: Positive for pancytopenia.   PHYSICAL EXAMINATION VITAL SIGNS: Temperature 98.5, pulse 100, respirations 20, blood pressure 142/86, pulse ox 99%. Last sugar was hours ago at  405.  GENERAL: No respiratory distress, lying flat in bed eating a popsicle as I was talking with her but then developed nausea.  EYES: Conjunctivae and lids normal. Pupils equal, round and  reactive to light. Extraocular muscles intact. No nystagmus.  EARS, NOSE, MOUTH AND THROAT: Tympanic membrane on the left bulging and erythematous. Tympanic membrane on the right no erythema. Throat no erythema, no exudate seen. Lips and gums no lesions.  NECK: No JVD. No bruits. No lymphadenopathy. No thyromegaly. No thyroid nodules palpated.  LUNGS: Clear to auscultation. No use of accessory muscles to breathe. No rhonchi, rales or wheeze heard.  CARDIOVASCULAR: S1, S2, tachycardic. No gallops, rubs or murmurs heard. Carotid upstroke 2+ bilaterally. No bruits. Dorsalis pedis pulses difficult to palpate on lower extremity. No edema of the lower extremity.  ABDOMEN: Soft. Positive tenderness throughout the entire abdomen. Positive for splenomegaly. Normoactive bowel sounds. No masses felt.  LYMPHATIC: No lymph nodes in the neck.  MUSCULOSKELETAL: No clubbing, edema or cyanosis.  SKIN: Chronic lower extremity discoloration. Calluses on bilateral feet under the first metatarsal.  NEUROLOGIC: Cranial nerves II through XII grossly intact. Deep tendon reflexes 2+ bilateral lower extremities.  PSYCHIATRIC: The patient is oriented to person, place and time.   LABORATORY AND RADIOLOGICAL DATA: Glucose 676, BUN 15, creatinine 0.86, sodium 131, potassium 3.3, chloride 88, CO2 of 34, calcium 8.9. Liver function tests: Alkaline phosphatase slightly elevated at 205, lipase 179. White blood cell count 5.1, H and H 11.4 and 37.0, platelet count of 90. Urinalysis 1+ blood, greater than 500 mg/dL of glucose. Chest x-ray showed mild infiltrate of the left lung. CT scan of the abdomen and pelvis showed large amount of air and fluid in the stomach, correlate for a gastric outlet obstruction; moderate amount of within loops of bowel and large amount of fecal material in the colon.   EKG: Normal sinus rhythm, 96 beats per minute, nonspecific ST-T wave changes.   ASSESSMENT AND PLAN: 1.  Nausea, vomiting, abdominal pain  and diarrhea. CT scan showed a distended stomach with fluid. Will place an NG tube to suction and get a gastroenterology consultation. Supportive management with IV fluids, IV Protonix, IV pain medications, IV Zofran and IV Ativan. THE PATIENT HAS AN ALLERGY TO REGLAN so I will not give this. This could be a diabetic gastroparesis since she is not taking medications for diabetes and sugars are uncontrolled. Will also get an ultrasound of the abdomen to rule out gallbladder disease as a cause. Could also be chronic constipation with her pain medications. We will continue to monitor her during hospital course.  2.  Diabetes, uncontrolled. We will get a hemoglobin A1c, give 10 units of IV insulin stat and put on sliding scale and low-dose Lantus since the patient will be n.p.o. The patient is not in diabetic ketoacidosis at this point. Will give IV fluids with potassium.  3.  Chronic respiratory failure, chronic obstructive pulmonary disease, on oxygen. Respiratory status is stable at this point.  4.  Hypothyroidism. We will give IV levothyroxine and check a TSH in the a.m.  5.  Chronic pain. We will give IV Dilaudid.  6.  Possible pneumonia on chest x-ray. We will give her Rocephin and Zithromax. Blood cultures. Will hold off on steroids at this point since lungs are clear.  7.  Pancytopenia, does follow up as outpatient. I do not have labs within the last 3 years here. I will continue to monitor.   TIME SPENT ON ADMISSION: 28  minutes.    ____________________________ Herschell Dimesichard J. Renae GlossWieting, MD rjw:[id number] D: 12/18/2012 14:04:00 ET T: 12/18/2012 14:16:11 ET JOB#: [id number]  cc: Herschell Dimesichard J. Renae GlossWieting, MD, <Dictator> Leo GrosserNancy J. Maloney, MD Salley ScarletICHARD J Bracen Schum MD ELECTRONICALLY SIGNED 12/22/2012 14:23

## 2014-08-08 NOTE — Consult Note (Signed)
PATIENT NAME:  [name] [name], [name] [name] MR#:  [id number] DATE OF BIRTH:  [birth year]  DATE OF CONSULTATION:  12/20/2012  CONSULTING PHYSICIAN:  Audery AmelJohn T. Clapacs, MD  IDENTIFYING INFORMATION AND REASON FOR CONSULT: A 44 year old woman admitted to the hospital for abdominal pain and vomiting. Consult because of concern about suicidal ideation.   HISTORY OF PRESENT ILLNESS: Information obtained from the patient, the patient's daughter, and from the chart. It appears that around the time of admission during a screening questionnaire, the patient's husband made a statement to the effect that he thought there is some risk that the patient could kill herself. Apparently, at that time she was put on suicide precautions, which the family felt was unwarranted.   The patient tells me today that normally at home her mood is good. She does have chronic medical problems that bother her, but she nevertheless enjoys being with her family and with her cat. She does not feel consistently depressed. She has a reversed sleep schedule, but it is a stable sleep schedule. Normally her appetite had been okay, but her appetite is impaired recently because of the abdominal pain.   The patient denies that she has any kind of suicidal ideation at baseline. Denies feeling hopeless about her life. Denies any psychotic symptoms. She says that occasionally she and her husband will get into arguments which often escalate to the point of screaming, and at that point, sometimes she will behave in a dramatic way by saying that she is going to kill herself. She says that she never actually means that she is going to carry through on it, that this is just something she says when she is angry. Does not report any psychotic symptoms. She says she has been compliant with her medication.   She is currently taking Cymbalta prescribed by her outpatient provider and also takes lorazepam.   PAST PSYCHIATRIC HISTORY: No history of psychiatric  hospitalization. No history of treatment with a mental health provider. She has been prescribed Cymbalta by her primary care doctor for a combination of pain and mood symptoms. She has been on it for most of a year and says that it helps. Her daughter does mention that the patient took an intentional overdose of pills, or at least came close to doing it, about a year and a half ago. The patient does not even recall the incident. Apparently it was one of these fights that she is describing. The patient cannot  remember ever actually wanting to kill herself. Apparently what happened at that time did not result in any particular treatment.   SUBSTANCE ABUSE HISTORY: The patient denies any alcohol or drug abuse now or in the past.   PAST MEDICAL HISTORY: The patient has chronic medical problems which, according to her, are the result of complications from surgery. She also appears to have probably chronic COPD and wears oxygen at home. Right now she is in the hospital for abdominal pain that is being evaluated.   REVIEW OF SYSTEMS: Complains of abdominal pain but says it is much better than it was a couple of days ago. No longer vomiting constantly. Still feels weak. Does not have any sleep complaints. Says that her mood is feeling okay. Denies suicidal ideation or homicidal ideation and denies any psychotic symptoms.   MENTAL STATUS EXAMINATION: A thin chronically ill-looking woman interviewed in her hospital room. Her daughter is present, and the patient wants the daughter to stay there. The patient was initially hesitant to cooperate with me  but then agreed with the encouragement of her daughter. Eye contact was good. Psychomotor activity appropriate. Speech normal in rate, tone and volume. Affect reactive, appropriate. Smiled and laughed appropriately. Did not have any tearfulness. States that she is feeling okay. Thoughts were lucid without and sign of loosening of associations or delusions. Denied auditory  or visual hallucinations. Denied any suicidal or homicidal ideation. Indicated positive thoughts about her life and plans for the future. Alert and oriented x4. Normal intelligence.   ASSESSMENT: This is a 44 year old woman who was admitted to the hospital for abdominal pain. At some point, some statement was made about suicidal ideation. Evidently, ever since then, the patient and the family have insisted that she is not in any actual risk to harm herself. The consult was put in when she was admitted on the 2nd. Because of the mistake in communication, I was not notified about the consult until this morning.   At this point, it appears that she has already been taken off suicide precautions. I do not see any signs of her having a depression or other acute psychiatric illness. She is not currently endorsing suicidal ideation. She has multiple protective factors. She does have some risk in that she has a history of impulsive threatening behavior and having made suicidal statements in the past but currently does not appear to be at significant short-term risk to harm herself.   TREATMENT PLAN: No change to her current medication. Continue antidepressant. (Dictation Anomaly)supportive therapy done with the patient. If she were not already off of precautions, I would take them off. She does not need a Comptroller. No further psychiatric follow-up needed at this time.   DIAGNOSIS: AXIS I: Adjustment disorder with depressed mood.   SECONDARY DIAGNOSES:  AXIS I: No further.  AXIS II: No diagnosis.  AXIS III: Chronic pain, acute abdominal pain and vomiting, chronic obstructive pulmonary disease.  AXIS IV: Severe from chronic illness.  AXIS V: Functioning at time of evaluation: 60.   ____________________________ Audery Amel, MD jtc:np D: 12/20/2012 16:19:19 ET T: 12/20/2012 16:43:42 ET JOB#: [id number]  cc: Audery Amel, MD, <Dictator> Audery Amel MD ELECTRONICALLY SIGNED 12/20/2012 23:48

## 2014-08-08 NOTE — Consult Note (Signed)
CC: abd pain and vomiting. Pt unable to eat the test meal, ate about half of the sandwich.  She denies any abd pain or nausea or vomiting since eating the sandwich.  She has been advanced to a diabetic diet.  Abd flat soft and non tender at this time with bowel sounds.  If pt does well with diet I recommend PPI once a day for 2 months.  No need for EGD at this time.  Electronic Signatures: Scot JunElliott, Robert T (MD)  (Signed on 04-Sep-14 15:12)  Authored  Last Updated: 04-Sep-14 15:12 by Scot JunElliott, Robert T (MD)

## 2014-08-08 NOTE — Consult Note (Signed)
CC: vomiting and diarrhea and abd pain.  See dictated note.  Will check for alpha 1 antitrypsin level and hemochromatosis and primary biliary cirrhosis.  Electronic Signatures: Scot JunElliott, Lace Chenevert T (MD)  (Signed on 02-Sep-14 17:18)  Authored  Last Updated: 02-Sep-14 17:18 by Scot JunElliott, Rebie Peale T (MD)

## 2014-08-08 NOTE — Consult Note (Signed)
CC: vomiting and abd pain and diarrhea.  Pt feels better today, no abd pain, no vomiting.  She is on for gastric empty study tomorrow.  Abd exam shows abd not distended, soft, bowel sounds active. US showed stone versus polyp,  plt ct 66K, TSH low at 0.37, Mg a bit low at 1.4  VSS afebrile.  Pt pulled NG out, to be left out for now.  No new suggestions.   Consider EGD if empty study is neg.  Await cirrhosis labs.  Electronic Signatures: Scot JunElliott, Robert T (MD)  (Signed on 03-Sep-14 16:57)  Authored  Last Updated: 03-Sep-14 16:57 by Scot JunElliott, Robert T (MD)

## 2014-08-08 NOTE — Discharge Summary (Signed)
PATIENT NAME:  JOYE, [name] [name] MR#:  [id number] DATE OF BIRTH:  04-25-1970  DATE OF ADMISSION:  12/18/2012 DATE OF DISCHARGE:  12/21/2012  ADMITTING DIAGNOSIS: Nausea, vomiting, abdominal pain and diarrhea.   DISCHARGE DIAGNOSES: 1.  Nausea, vomiting and abdominal pain due to possible gastroparesis. The patient is status post NG tube decompression and improvement in her symptoms. She was seen by gastroenterology who recommended proton pump inhibitors. She is not tolerant to Reglan so started on erythromycin base.  2.  Thought to have possible pneumonia on chest x-ray, however, the patient is asymptomatic. No fevers, no cough and WBC count was normal. Antibiotics discontinued after 4 days.  3.  Diabetes. Very poorly controlled. Was not on any treatment. The patient will be discharged on 70/30 insulin.  4.  Chronic respiratory failure with chronic obstructive pulmonary disease on chronic oxygen.  5.  Hypothyroidism. Suppressed TSH with normal free T4.  6.  Chronic pain, on methadone.  7.  Pancytopenia. Needs outpatient follow-up with hematology.  8.  Anxiety disorder.  9.  History of deep vein thrombosis in the past.  10.  Chronic leg pain.  11.  Status post surgery in the groin which was complicated with femoral artery tear and required urgent repair. Complicated with infection and operated again. History of internal bleeding status post filters for deep vein thrombosis.  12.  Status post hysterectomy.  13.  Status post melanoma resection of the shoulder.   CONSULTANTS: Dr. Vira Agar, Dr. Weber Cooks.  PERTINENT LABS AND EVALUATIONS: Admitting glucose was 676, BUN 15, creatinine 0.86, sodium 131, potassium 3.3, chloride 88, CO2 34. Magnesium 1.6. Lipase 179. Hemoglobin A1c 11.8. LFTs were normal, except alk phos of 205. WBC 5.1, hemoglobin 11.4, platelet count was 90. Urine culture no growth. Blood culture no growth. Alpha-1 antitrypsin levels normal. Mitochondrial antibodies were 4.5, which is  normal.   CT scan of the abdomen showed a large amount of air and fluid in the stomach. Correlate for evidence of gastric outlet obstruction. Moderate air within the loops of small bowel. Splenomegaly. Inferior vena cava. Moderate amount of urine in the urinary bladder.   Ultrasound of the abdomen showed no acute abnormality including splenomegaly or hepatomegaly, except cholelithiasis.   HOSPITAL COURSE: Please refer to H and P done by the admitting physician. The patient is a 44 year old white female with history of chronic pain deep, DVT, pancytopenia, diabetes, COPD and hypothyroidism with med noncompliance with her diabetes presented with nausea, vomiting and abdominal pain. The patient had a CT scan of the abdomen done in the ED which showed abdominal distention with fluid in the abdomen. She had a NG tube placed with decompression of the GI contents. We were concerned that she may have gastroparesis and so plan was for her to have a gastric emptying study. However, the patient was unable to do the gastric emptying study. She was seen in consultation by GI. She was started on erythromycin base with significant improvement in her symptoms. GI felt that she did not need an EGD. In terms of pancytopenia, she has a history of this. She needs outpatient hematology follow-up. The patient also on presentation, her husband had stated that she has had suicidal ideation in the past so she was seen by psychiatry. They did not feel that she was suicidal. At this time, she is tolerating diet. She is also started on insulin for her untreated diabetes. She is currently stable for discharge.   DISCHARGE MEDICATIONS:  1.  Gabapentin 300 mg  at bedtime. 2.  Hydromorphone 4 mg 1 tab p.o. b.i.d. 3.   80 mg daily. 4.  Ambien 10 mg at bedtime. 5.  Nortriptyline 50 mg at bedtime. 6.  Duloxetine 60 mg daily.  7.  Hydrochlorothiazide 50 mg daily. 8.  Metoprolol 100 mg 1 tab p.o. at bedtime. 9.  Glipizide 10 mg 1 tablet  p.o. b.i.d. 10.  Erythromycin base 250 mg every 8 hours. 11.  Humulin 70/30 20 units b.i.d.  12.  Omeprazole 40 mg daily. 13.  O2 to be continued at 2 liters as taking previously.   DIET: Low-sodium, low-fat, low-cholesterol.   ACTIVITY: As tolerated.   DISCHARGE FOLLOWUP:  With primary MD in 1 to 2 weeks. Follow with Dr. Vira Agar in 2 to 4 weeks. Follow with hematology in 2 to 4 weeks. The patient is to check her blood sugars prior to each meal and keep a log.   TIME SPENT ON DISCHARGE: 35 minutes. ____________________________ Lafonda Mosses Posey Pronto, MD shp:sb D: 12/21/2012 14:19:08 ET T: 12/21/2012 16:01:10 ET JOB#: [id number]  cc: Latori Beggs H. Posey Pronto, MD, <Dictator> Alric Seton MD ELECTRONICALLY SIGNED 12/29/2012 14:15

## 2014-08-08 NOTE — Consult Note (Signed)
PATIENT NAME:  [name] [name], [name] [name] MR#:  [id number] DATE OF BIRTH:  Jul 16, 1970  DATE OF CONSULTATION:  12/18/2012  CONSULTING PHYSICIAN:  Manya Silvas, MD  HISTORY OF PRESENT ILLNESS:  The patient is a 44 year old female with a history of long-standing multiple significant medical problems who was admitted with nausea, vomiting, abdominal pain and diarrhea. She had a CAT scan that showed constipation and a large amount of air and fluid in the stomach, possible gastric outlet obstruction versus severe gastroparesis. She also was noted to have a very elevated blood sugar of 676. Because of these problems, she was admitted to the hospital. I was asked to see her in consultation.   PAST MEDICAL HISTORY: Complex. She has COPD with endstage chronic respiratory failure. She quit smoking at age 67. She has a history of an evaluation three years ago for pancytopenia and was noted to have an enlarged spleen with fatty liver changes. She has a history of diabetes for 8 years. She was on insulin in the past. She quit a year ago. She has been on oral medicine in the past. She has not been on that in over a year.   The patient had significant medical complications in 1660 with groin surgery that had a femoral artery tear and required emergency transfer to [facility]. This was complicated by infections and she had a complicated course. 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   OTHER MEDICAL PROBLEMS: Include hysterectomy, melanoma of the shoulder, COPD, hypothyroidism, diabetes, chronic pain, pancytopenia.   MEDICATIONS: Multiple and are reviewed in the chart. Significant for Cymbalta, gabapentin, hydrochlorothiazide triamterene, hydromorphone, hydroxyzine, hydrochlorothiazide,  levothyroxine, lorazepam, methadone, metoprolol, nortriptyline, promethazine, vitamin D2  50,000 units once a week, Vivelle-Dot 0.1 mg every 24 hours twice weekly, Ambien 10 mg at night.   HABITS: Quit smoking at age 71.   FAMILY HISTORY:  Father died at age 5 after surgical procedure. Mother is alive with diabetes.   REVIEW OF SYSTEMS: Noted by the primary admitting doctor and reviewed. Positive for weight loss, fatigue, visual deficiency, chest pain, vomiting, shortness of breath, abdominal pain, vomiting and diarrhea. Positive for depression, hypothyroidism, pancytopenia.   PHYSICAL EXAMINATION:  GENERAL: Frail, weak-appearing female in no acute distress.  HEENT: Sclerae anicteric. Conjunctivae negative. Tongue negative.  HEAD: Atraumatic.  CHEST: Clear.  HEART: Showed no murmur or gallop.  ABDOMEN: Not distended. Bowel sounds are very minimal. No palpable hepatosplenomegaly at this time. There is scarring in the left lower mid abdominal area.  NEUROLOGIC: Awake and responds to questions.  PSYCHIATRIC: The nurses have reported that the patient wished to harm herself and they have requested a sitter.   LABORATORY DATA: Glucose 676, has fallen to 381 with therapy. BUN 15, creatinine 0.86, sodium 131, potassium 3.3, chloride 88, CO2 34, calcium 8.9, alk phos 205, lipase 179. White blood count 5.1, hemoglobin 11.4, platelet count 90. UA shows a large amount of glucose.   Chest x-ray shows a questionable mild infiltrate, left lung.   CT scan: Large amount of air-fluid in the stomach. Moderate amount in loops of bowel and large amount and fecal in the colon.   EKG shows nonspecific ST-T wave changes.   ASSESSMENT: Acute nausea, vomiting and diarrhea. The patient's family says she ate the same food that they did and they did not get sick. The patient is with diabetes. Can have neuropathy at the enteric nervous system of the stomach and this may cause chronic or acute problems. She could have a gastric outlet  obstruction from ulcers. Could be gastroparesis. Chronic constipation related to pain medication and diabetic neuropathy are possible.   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 I cannot get a significant history of alcoholism, although it has not been ruled out.   RECOMMENDATIONS:  1.  The patient wants to have the tube removed from her nose. I am encouraging her to leave it in overnight given the decreased bowel sounds. I am concerned that she may have worsening of her problems because of gastroparesis. I have talked with her husband and he does not want her tube pulled either. I did explain to him that we will not pull the tube, but if she pulls it out, we will not reinsert it. I  talked to the nurse about this.  2.  Recommend blood work for alpha-1 antitrypsin deficiency. Recommend blood work, genetic, for hemochromatosis. Cystic fibrosis is also a possibility.   No indication for endoscopy at this time. I do not think she would be a good candidate for it.   I agree with antibiotic coverage, pain medicine, and IV pantoprazole, and I will follow with you.    ____________________________ Manya Silvas, MD rte:np D: 12/18/2012 16:55:00 ET T: 12/18/2012 17:10:52 ET JOB#: [id number]  cc: Jerrell Belfast, MD Tana Conch. Leslye Peer, MD Manya Silvas, MD, <Dictator>    Manya Silvas MD ELECTRONICALLY SIGNED 01/11/2013 13:47

## 2014-08-08 NOTE — Consult Note (Signed)
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   Patient was seen by consultant.   Consult note dictated.   Comments: Psychiatry: Patient seen. Chart reviewed. Full note done. My apology for this being late. Communication failed and I was not notified of this consult until today. PAtient does not have symptoms of depression and is alert and lucid. Does not have significant risk of suicidality. Precautions already have been discontinued. No change to treatment.  Electronic Signatures: Audery Amellapacs, Ignatius Kloos T (MD)  (Signed 04-Sep-14 16:11)  Authored: Brief Consult Note   Last Updated: 04-Sep-14 16:11 by Audery Amellapacs, Shantrell Placzek T (MD)

## 2014-08-09 NOTE — Consult Note (Signed)
Chief Complaint:  Subjective/Chief Complaint seen for n/v abdominal pain.  some better today, though mild nausea no emesis, no abdominal pain.   VITAL SIGNS/ANCILLARY NOTES: **Vital Signs.:   18-Apr-15 14:01  Vital Signs Type Routine  Temperature Temperature (F) 97.6  Celsius 36.4  Temperature Source oral  Pulse Pulse 80  Respirations Respirations 17  Systolic BP Systolic BP 938  Diastolic BP (mmHg) Diastolic BP (mmHg) 67  Mean BP 78  Pulse Ox % Pulse Ox % 98  Pulse Ox Activity Level  At rest  Oxygen Delivery 3L  *Intake and Output.:   18-Apr-15 00:23  Stool  Moderate amount of light-brown/clay-colored, formed stool.   Brief Assessment:  Cardiac Regular   Respiratory clear BS   Gastrointestinal details normal Soft  No masses palpable  Bowel sounds normal  No rebound tenderness  mild epigastric discomfort to palpation   Lab Results: Thyroid:  17-Apr-15 03:59   Thyroid Stimulating Hormone  50.3 (0.45-4.50 (International Unit)  ----------------------- Pregnant patients have  different reference  ranges for TSH:  - - - - - - - - - -  Pregnant, first trimetser:  0.36 - 2.50 uIU/mL)  Routine Chem:  17-Apr-15 03:59   Glucose, Serum  202  BUN 11  Creatinine (comp) 0.71  Sodium, Serum  133  Potassium, Serum  3.3  Chloride, Serum  97  CO2, Serum 30  Calcium (Total), Serum  8.4  Anion Gap  6  Osmolality (calc) 272  eGFR (African American) >60  eGFR (Non-African American) >60 (eGFR values <49m/min/1.73 m2 may be an indication of chronic kidney disease (CKD). Calculated eGFR is useful in patients with stable renal function. The eGFR calculation will not be reliable in acutely ill patients when serum creatinine is changing rapidly. It is not useful in  patients on dialysis. The eGFR calculation may not be applicable to patients at the low and high extremes of body sizes, pregnant women, and vegetarians.)  Result Comment PLATELETS - VERIFIED BY SMEAR ESTIMATE  - TPL   Result(s) reported on 02 Aug 2013 at 05:58AM.  Cholesterol, Serum  290  Triglycerides, Serum  258  HDL (INHOUSE)  33  VLDL Cholesterol Calculated  52  LDL Cholesterol Calculated  205 (Result(s) reported on 02 Aug 2013 at 05:16AM.)  18-Apr-15 04:08   Glucose, Serum  136  BUN 9  Creatinine (comp) 0.73  Sodium, Serum 136  Potassium, Serum 3.8  Chloride, Serum 102  CO2, Serum 28  Calcium (Total), Serum  8.0  Anion Gap  6  Osmolality (calc) 273  eGFR (African American) >60  eGFR (Non-African American) >60 (eGFR values <64mmin/1.73 m2 may be an indication of chronic kidney disease (CKD). Calculated eGFR is useful in patients with stable renal function. The eGFR calculation will not be reliable in acutely ill patients when serum creatinine is changing rapidly. It is not useful in  patients on dialysis. The eGFR calculation may not be applicable to patients at the low and high extremes of body sizes, pregnant women, and vegetarians.)  Routine Hem:  16-Apr-15 11:50   Platelet Count (CBC)  137  17-Apr-15 03:59   WBC (CBC) 4.6  RBC (CBC)  5.58  Hemoglobin (CBC)  11.0  Hematocrit (CBC)  34.8  Platelet Count (CBC)  118  MCV  62  MCH  19.7  MCHC  31.5  RDW  19.2  Neutrophil % 73.3  Lymphocyte % 18.3  Monocyte % 7.9  Eosinophil % 0.1  Basophil % 0.4  Neutrophil # 3.3  Lymphocyte #  0.8  Monocyte # 0.4  Eosinophil # 0.0  Basophil # 0.0   Radiology Results: XRay:    18-Apr-15 13:29, Abdomen Flat and Erect  Abdomen Flat and Erect   REASON FOR EXAM:    nv abd pain  COMMENTS:   LMP: Post Hysterectomy    PROCEDURE: DXR - DXR ABDOMEN 2 V FLAT AND ERECT  - Aug 03 2013  1:29PM     CLINICAL DATA:  Abdominal pain with nausea and vomiting    EXAM:  ABDOMEN - 2 VIEW    COMPARISON:  April16, 2015    FINDINGS:  Supine and upright images were obtained. Contrast is seen throughout  the large bowel. There is moderate dilatation of the stomach with  fluid and air. There are a  few air-fluid levels in small while.  There is no free air.    Liver and spleen are enlarged. There is a stent in the left iliac  artery region. There is a filter in the inferior vena cava.     IMPRESSION:  There are several air-fluid levels in the upper abdomen as well as a  distended stomach with an air-fluid level in the stomach on the  upright image. Question ileus versus early obstruction. Note that  there is contrast in a nondilated large bowel. There is hepatomegaly  and splenomegaly. No apparent free air.      Electronically Signed    By: Lowella Grip M.D.    On: 08/03/2013 17:24         Verified By: Leafy Kindle. WOODRUFF, M.D.,   Assessment/Plan:  Assessment/Plan:  Assessment 1) abdominal apin nausea and vomiting.  possible early ileus on abd films today.  patient with regular narcotics use, possible related to diabetic gastroparesis exacerbated by narcotics use.   Plan 1) will recheck abd films in am, egd monday as clinically feasible.  If showing ileus will be unable to do egd (relative contraindication) continue current, try to limit pain meds.   Electronic Signatures: Loistine Simas (MD)  (Signed 18-Apr-15 21:05)  Authored: Chief Complaint, VITAL SIGNS/ANCILLARY NOTES, Brief Assessment, Lab Results, Radiology Results, Assessment/Plan   Last Updated: 18-Apr-15 21:05 by Loistine Simas (MD)

## 2014-08-09 NOTE — Consult Note (Signed)
Chief Complaint:  Subjective/Chief Complaint seen for nausea vomiting abdominal pain.  improving.  Please seee full GI consult and brief consult note. Marland Kitchen.  Patietn with possible h/o gastroparesis in the setting of DM.  Now tolerating some clears on trial.   Continue to slowly advance diet through full liquids to low residue.  Will follow, recommend egd early in the week, monday?.   Electronic Signatures: Barnetta ChapelSkulskie, Meshawn Oconnor (MD)  (Signed 17-Apr-15 21:26)  Authored: Chief Complaint   Last Updated: 17-Apr-15 21:26 by Barnetta ChapelSkulskie, Kristine Tiley (MD)

## 2014-08-09 NOTE — H&P (Signed)
PATIENT NAME:  [name] [name], [name] [name] MR#:  [id number] DATE OF BIRTH:  1971-04-07  DATE OF ADMISSION:  05/20/2013  REFERRING PHYSICIAN: Dr. Thomasene Lot.   PRIMARY CARE PHYSICIAN: Dr. Venia Minks.   CHIEF COMPLAINT: Foot pain.   HISTORY OF PRESENT ILLNESS: A 44 year old Caucasian female with history of peripheral vascular disease status post bypass, hypertension, diabetes, presenting with bilateral foot pain. Describes 3 to 4 days' duration of foot pain, originally sharp, 7 out of 10 in intensity, nonradiating. Worsened with standing. No relieving factors. Left side worse than right side. Noted purulent, foul-smelling discharge for the same duration. Attempted to see podiatrist as an outpatient; however, was unable to schedule an appointment. Denies any fevers; however, does have subjective chills. Currently without complaints.   REVIEW OF SYSTEMS:   CONSTITUTIONAL: Denies fever, fatigue, weakness. Positive for chills.  EYES: Denies blurry vision, double vision, eye pain.  EARS, NOSE, THROAT: Denies tinnitus, ear pain, hearing loss.  RESPIRATORY: Denies cough, wheeze, shortness of breath.  CARDIOVASCULAR: Denies chest pain, palpitations, edema.  GASTROINTESTINAL: Denies nausea, vomiting, diarrhea, abdominal pain.  GENITOURINARY: Denies dysuria or hematuria.   ENDOCRINE: Denies nocturia or thyroid problems.  HEMATOLOGIC AND LYMPHATIC: Denies easy bruising or bleeding.  SKIN: Denies rash or lesions.  MUSCULOSKELETAL: Positive for pain in bilateral feet. Denies pain in neck, back, shoulders, knees, hips or arthritic symptoms.  NEUROLOGIC: Denies paralysis, paresthesias.  PSYCHIATRIC: Denies anxiety or depressive symptoms.   Otherwise, full review of systems performed by me is negative.   PAST MEDICAL HISTORY: Diabetes, hypertension, hypothyroidism, peripheral vascular disease status post bypass therapy, undiagnosed lung disorder currently, chronic respiratory failure on 3 to 5 liters nasal cannula at  baseline.   SOCIAL HISTORY: Denies any recent tobacco abuse; however, positive for remote tobacco abuse. Denies any alcohol use.   FAMILY HISTORY: Positive for diabetes.   ALLERGIES: IBUPROFEN, LOVENOX AND REGLAN.   HOME MEDICATIONS: Hydromorphone 4 mg p.o. b.i.d., methadone 80 mg p.o. daily, gabapentin 300 mg p.o. at bedtime, duloxetine 60 mg p.o. daily, nortriptyline 50 mg p.o. at bedtime, Humulin 70/30 22 units b.i.d., hydroxyzine 25 mg 2 tablets p.o. daily, Lunesta 1 mg p.o. daily as needed for sleep, Ambien 10 mg p.o. daily as needed for sleep, metoprolol 100 mg extended release p.o. daily, erythromycin 250 mg p.o. every 8 hours, Prilosec 40 mg p.o. daily, levothyroxine 50 mcg p.o. daily.   PHYSICAL EXAMINATION:  VITAL SIGNS: Temperature 98.4, heart rate 78, respirations 18, blood pressure 105/67, saturating 98% on 3 liters nasal cannula. Weight 54.4 kg, BMI 22.7.  GENERAL: Disheveled, unkempt, Caucasian female, currently in no acute distress.  HEAD: Normocephalic, atraumatic.  EYES: Pupils equal, round and reactive to light. Extraocular movements intact. No scleral icterus; however, does have conjunctival erythema.  MOUTH: Moist mucosal membranes. Poor dentition. No abscess noted.  EARS, NOSE, THROAT: Throat is clear without exudates. No external lesions.  NECK: Supple. No thyromegaly. No nodules. No JVD.  PULMONARY: Clear to auscultation bilaterally without wheeze, rubs or rhonchi. No use of accessory muscles. Good respiratory effort.  CHEST: Nontender to palpation.  CARDIOVASCULAR: S1, S2, regular rate and rhythm. No murmurs, rubs or gallops. No edema. Pedal pulses 2+ bilaterally.  GASTROINTESTINAL: Soft, nontender and nondistended. No masses. Positive bowel sounds. No hepatosplenomegaly.  MUSCULOSKELETAL: Left lower extremity edema 1+ which is chronic in nature, as well as purplish discoloration, also chronic in nature. No further swelling, clubbing. Range of motion full in all  extremities. Bilateral feet with callus over the first digit at the  base of the metatarsal, left greater than right. Noted to be necrotic as well as some bleeding and purulent discharge.  NEUROLOGIC: Cranial nerves II through XII intact. No gross focal neurological deficits. Sensation and reflexes intact.  SKIN: No ulcerations. Lesions as described above. No further lesions. No rashes, cyanosis. Skin warm, dry. Turgor intact.  PSYCHIATRIC: Mood and affect blunted. Awake, oriented x 3. Insight and judgment intact.   LABORATORY DATA: Sodium 132, potassium 3.7, chloride 92, bicarb 36, BUN 10, creatinine 0.92, glucose 397. Total protein 69, albumin 3.3, alk phos 143, AST 14. Remainder of LFTs within normal limits. WBC 4.8, hemoglobin 10.8, platelets 115. Foot x-rays performed. No evidence of osteomyelitis.    ASSESSMENT AND PLAN: A 44 year old Caucasian female with history of peripheral vascular disease status post bypass, hypertension, diabetes, presenting with left foot pain. 1. Cellulitis of the left foot: Blood cultures obtained. Antibiotic coverage. Will consult podiatry. Likely will be able to switch over to p.o. quickly and subsequently discharge.  2. Diabetes: Hold p.o. agents. Add insulin sliding scale. Continue 70/30 insulin.  3. Hypothyroidism: Continue with Synthroid.  4. Hypertension: Continue with metoprolol.  5. Venous thromboembolism prophylaxis with sequential compression devices.   The patient is FULL CODE.    TIME SPENT: 45 minutes.   ____________________________ Aaron Mose. Hower, MD dkh:gb D: 05/20/2013 23:13:39 ET T: 05/20/2013 23:42:16 ET JOB#: [id number]  cc: Aaron Mose. Hower, MD, <Dictator> DAVID Woodfin Ganja MD ELECTRONICALLY SIGNED 05/21/2013 1:02

## 2014-08-09 NOTE — Consult Note (Signed)
Chief Complaint:  Subjective/Chief Complaint seen for abdominal pain, n/v.  continues with nausea today, less abd pain no emesis.   VITAL SIGNS/ANCILLARY NOTES: **Vital Signs.:   19-Apr-15 14:18  Vital Signs Type Routine  Temperature Temperature (F) 97.6  Temperature Source oral  Pulse Pulse 71  Respirations Respirations 17  Systolic BP Systolic BP 139  Diastolic BP (mmHg) Diastolic BP (mmHg) 90  Mean BP 106  Pulse Ox % Pulse Ox % 93  Pulse Ox Activity Level  At rest  Oxygen Delivery 3L   Brief Assessment:  Cardiac Regular   Respiratory clear BS   Gastrointestinal details normal Soft  Nondistended  No masses palpable  Bowel sounds normal  mild epigastric discomfort.   Lab Results: Routine Hem:  16-Apr-15 11:50   Platelet Count (CBC)  137  17-Apr-15 03:59   Platelet Count (CBC)  118   Radiology Results: XRay:    19-Apr-15 14:50, Abdomen Flat and Erect  Abdomen Flat and Erect   REASON FOR EXAM:    ileus/Obstruction? abd pain  COMMENTS:       PROCEDURE: DXR - DXR ABDOMEN 2 V FLAT AND ERECT  - Aug 04 2013  2:50PM     CLINICAL DATA:  44 year old female with abdominal pain. Initial  encounter.    EXAM:  ABDOMEN - 2 VIEW    COMPARISON:  08/03/2013 and earlier.    FINDINGS:  Retained oral contrast in bowel loops, primarily the colon. Moderate  to severe distension of the stomach now with both gas and fluid. No  dilated small bowel. No pneumoperitoneum. Stable lung bases. IVC  filter and left iliac vascular stent. Stable left lower quadrant  surgical clips. Stable visualized osseous structures.     IMPRESSION:  1. Moderate to severe distension of the stomach. Recommend NG tube  decompression.  2. Otherwise Non obstructed bowel gas pattern.  No free air.      Electronically Signed    By: Augusto GambleLee  Hall M.D.    On: 08/04/2013 15:56     Verified By: Kevan NyHAROLD L. HALL, M.D.,   Assessment/Plan:  Assessment/Plan:  Assessment 1) n/v epigastric pain in the setting of  possible Diabetic gastroparesis and chronic narcotics/pain meds use. to my exam today there was only midl distension, however xrays indicated large gastric distension and recommended decompression via NGT.   Plan 1) serial abd exam, consider surgical consult if increased sx.   Electronic Signatures: Barnetta ChapelSkulskie, Makhari Dovidio (MD)  (Signed 19-Apr-15 17:41)  Authored: Chief Complaint, VITAL SIGNS/ANCILLARY NOTES, Brief Assessment, Lab Results, Radiology Results, Assessment/Plan   Last Updated: 19-Apr-15 17:41 by Barnetta ChapelSkulskie, Mikalah Skyles (MD)

## 2014-08-09 NOTE — Discharge Summary (Signed)
PATIENT NAME:  [name] [name], [name] MR#:  [id number] DATE OF BIRTH:  Dec 31, 1970  DATE OF ADMISSION: 08/01/2013 DATE OF DISCHARGE:  08/06/2013  PRESENTING COMPLAINT: Nausea, vomiting.   DISCHARGE DIAGNOSES:  1. Nausea, vomiting, resolved.  2. Suspected gastroparesis secondary to chronic narcotic use and type 2 diabetes.  3. Chronic methadone dependence.  4. Type 2 diabetes.   PROCEDURES: EGD showed normal esophagus. Gastritis. There were a few gastric polyps that were resected and retrieved. Flattened mucosa was found in the duodenum, not consistent with celiac disease.   CODE STATUS: Full code.   MEDICATIONS:  1. Gabapentin 100 mg 3 capsules once a day at bedtime.  2. Hydromorphone 4 mg p.o. twice a day.  3. Methadone 80 mg daily.  4. Duloxetine 60 mg p.o. daily.  5. Hydroxyzine hydrochloride 50 mg in the morning.  6. Zofran 4 mg 1 tablet 3 times a day as needed.  7. Ambien 5 mg 2 tablets once a day at bedtime.  8. Hydrochlorothiazide/triamterene 25/37.5 one p.o. daily.  9. Metformin/pioglitazone 850/15 mg 1 tablet b.i.d.  10. Lorazepam 0.5 mg p.o. t.i.d.  11. Humulin R 12 units b.i.d.  12. Humulin 70/30 at 38 units twice a day.  13. Levothyroxine 150 mcg p.o. daily.  14. Metoprolol XR 100 mg at bedtime.  15. Vitamin B12 p.o. daily.  16. Vitamin D3 p.o. daily.  17. Protonix 40 mg b.i.d.  18. Pravachol 20 mg at bedtime.   DIET: Low sodium, carbohydrate-controlled diet.   FOLLOWUP:  1. Follow up with Dr. Elease HashimotoMaloney in 1 to 2 weeks. 2. Follow up with Dr. Marva PandaSkulskie in 1 to 2 weeks.   CONSULTATIONS:  1. Surgery, Carmie Endalph L. Ely III, MD, and Adah Salvageichard E. Excell Seltzerooper, MD. 2. GI, Christena DeemMartin U. Skulskie, MD  BRIEF SUMMARY OF HOSPITAL COURSE: [name] [name] is a 44 year old Caucasian female with history of type 2 diabetes, hypertension, chronic abdominal pain, femoral artery tear status post repair in the past, COPD, who came in with abdominal pain, nausea and vomiting. She was admitted with:    1. Nausea and vomiting, which was suspected due to gastroparesis due to her chronic pain medications and type 2 diabetes. CT of the abdomen and pelvis was essentially negative. Abdominal x-ray showed possible ileus with gastric distention. GI, Dr. Marva PandaSkulskie, saw the patient and recommended upper GI endoscopy. It did not show any obstruction. A few polyps were resected, and gastritis was noted. The patient was recommended small frequent meals secondary to possible gastroparesis. No surgical issues per Dr. Michela PitcherEly, Dr. Excell Seltzerooper. The patient will follow up with Dr. Marva PandaSkulskie as outpatient. PPI was given at discharge.  2. Type 2 diabetes. Metformin, insulin sliding scale were continued.  3. Chronic pain, on methadone and hydromorphone.  4. Hypertension. Continued on Toprol and triamterene/hydrochlorothiazide.  5. Hypothyroidism. The patient was continued on her Synthroid.  6. Depression. Continued Cymbalta.  7. Overall hospital stay remained stable.   CODE STATUS: The patient remained a full code.   TIME SPENT: 40 minutes.     ____________________________ Wylie HailSona A. Allena KatzPatel, MD sap:lb D: 08/07/2013 06:56:53 ET T: 08/07/2013 07:37:29 ET JOB#: [id number]  cc: Hillary Struss A. Allena KatzPatel, MD, <Dictator> Leo GrosserNancy J. Maloney, MD Christena DeemMartin U. Skulskie, MD Quentin Orealph L. Ely III, MD Adah Salvageichard E. Excell Seltzerooper, MD  Willow OraSONA A Shandra Szymborski MD ELECTRONICALLY SIGNED 08/12/2013 9:13

## 2014-08-09 NOTE — H&P (Signed)
PATIENT NAME:  [name] [name], [name] [name] MR#:  [id number] DATE OF BIRTH:  [birth year]  DATE OF ADMISSION:  08/01/2013  PRIMARY CARE PROVIDER: Dr. Margarita Rana.   CHIEF COMPLAINT: Nausea, vomiting, abdominal pain.   HISTORY OF PRESENT ILLNESS: The patient is a 44 year old white female that I know from a previous admission in September. At that time, she had presented with similar type of intractable nausea, vomiting, and was thought to have possible gastroparesis. She also had a GI decompression at that time, who presents with complaint of nausea, vomiting, abdominal pain in the left lower quadrant for the past 3 days. The patient was seen in the ED and had a CT scan of the abdomen, then 2 days ago, which was negative for any acute abnormality. The patient continued to have pain and nausea and throwing up, therefore, came to the edition in the ED. In the ED, she has received 3 doses of IV lorazepam and 1 dose of hydromorphone and currently she is lethargic and is not able to give me much history.   PAST MEDICAL HISTORY: Significant for:  1.  Chronic abdominal pain.  2.  Diabetes, poor control.  3.  Chronic respiratory failure thought to be due to COPD, on chronic oxygen at home.  4.  History of hypothyroidism.  5.  Chronic pain, on multiple pain medications, also pancytopenia.  6.  Anxiety disorder.  7.  History of deep vein thrombosis in the past.  8.  Chronic leg pain.  9. History of surgery in the groin, which was complicated with femoral artery tear, which required urgent repair, complicated with infection and operation again.  10.  History of internal bleeding, status post  filter for DVT thrombosis.  11.  Status post hysterectomy.  12.  Status post melanoma resection of the shoulder.   ALLERGIES: REGLAN, LOVENOX, IBUPROFEN.   MEDICATIONS:  Ambien 10 mg at bedtime, duloxetine 60 daily, gabapentin 300 at bedtime, Humulin 70/30, 30 units b.i.d.; Humulin R 12 units b.i.d. prior to each meal,  hydrochlorothiazide/triamterene 25/37.5, 1 tab p.o. daily; hydromorphone 4 mg 1 tab p.o. b.i.d., hydroxyzine 50 in the morning, not at bedtime; levothyroxine 150 mcg daily, lorazepam 0.5, 1 tab p.o. t.i.d., metformin/pioglitazone 850/15 mg 1 tab p.o. b.i.d., methadone 80 mg daily, metoprolol succinate 100 mg daily, Prilosec 20 daily, vitamin B12, 1 tab p.o. daily; vitamin D3, 1 tab p.o. daily; Zofran 4 mg 3 times a day as needed.   SOCIAL HISTORY: Denies smoking, drinking or drugs; however, this is from her previous hospitalization.   FAMILY HISTORY: Father died at 46s after surgical procedure. Her mother is living with diabetes.   REVIEW OF SYSTEMS: Not obtainable due to the patient being lethargic.   PHYSICAL EXAMINATION: VITAL SIGNS: Temperature 98.4, pulse 98, respirations 16, blood pressure 161/98, O2 of 95%.  GENERAL: The patient is lethargic, but awakes when stimulated.  HEENT: Head atraumatic, normocephalic. Pupils equally round, reactive to light and accommodation. There is no conjunctival pallor. No scleral icterus. Nasal exam shows no drainage or ulceration.  Oropharynx is clear without any exudate.  NECK: Supple without any JVD.  CARDIOVASCULAR: Regular rate and rhythm. No murmurs, rubs, clicks or gallops.  LUNGS: Clear to auscultation bilaterally without any rales, rhonchi, wheezing.  ABDOMEN: Soft.  There is tenderness in the left lower quadrant. There is no guarding, rebound.  EXTREMITIES: No clubbing, cyanosis or edema.  SKIN: No rash.  LYMPHATICS: No lymph nodes palpable.  VASCULAR: Good DP, PT pulses.  PSYCHIATRIC: Currently not  anxious or depressed.  NEUROLOGIC:  The patient is currently sleepy. Was moving all extremities earlier.  PSYCHIATRIC: The patient currently sleepy from receiving sedation and pain medications.   IMAGING AND LABORATORY DATA: Glucose 393, BUN 20, creatinine 0.82, sodium 129, potassium 3.6, chloride 93. CO2 is 28, calcium 8.5, lipase 237. LFTs: Total  protein 7.5, albumin of 3.4, bili total 0.5. Alk phos is 150, AST 31, ALT 27. WBC 6.2, hemoglobin 12, platelet count 137. Urinalysis is negative. CT scan done on April 14th showed no acute abnormality. No small bowel obstruction. Splenomegaly without focal abnormality. Numerous subcentimeter low-density lesions in the left hepatic lobe, likely representing cysts.     ASSESSMENT AND PLAN: The patient is a 44 year old with history of similar presentation in the past on chronic pain medications, who presents with nausea and vomiting.  1.  Intractable nausea and vomiting, possibly flare of gastroparesis. Unable to tolerate Reglan. At this time, we will treat her with Zofran, give her IV fluids, GI consult.  2.  Diabetes. We will continue 70/30 insulin and metformin.  3.  Chronic pain. We will continue methadone; however, if she continues to be drowsy, then we will hold the sedating medications.  4.  Hypothyroidism. Will check a TSH, continue Synthroid.  5.  Chronic respiratory failure. We will continue 3 liters of O2, as taking at home.  6.  Miscellaneous: We will do SCDs for DVT prophylaxis.   NOTE: Time spent on this patient, 45 minutes.    ____________________________ Lafonda Mosses Posey Pronto, MD shp:dmm D: 08/01/2013 20:25:43 ET T: 08/01/2013 20:43:25 ET JOB#: [id number]  cc: Shreyang H. Posey Pronto, MD, <Dictator> Alric Seton MD ELECTRONICALLY SIGNED 08/02/2013 13:53

## 2014-08-09 NOTE — Discharge Summary (Signed)
PATIENT NAME:  [name] [name], [name] [name] MR#:  [id number] DATE OF BIRTH:  March 14, 1971  DATE OF ADMISSION:  05/20/2013 DATE OF DISCHARGE:  05/21/2013  PRIMARY CARE PHYSICIAN: Dr. Lorie Phenix.   CONSULTANT: Dr. Linus Galas from podiatry.   CHIEF COMPLAINT: Foot pain.   DISCHARGE DIAGNOSES: 1.  Bilateral ulceration first metatarsal in the setting of peripheral vascular disease and diabetic neuropathy with mild cellulitis.  2.  Uncontrolled diabetes.  3.  Hypertension.  4.  Peripheral vascular disease.  5.  Hypothyroidism.  6.  Hypertension.  7.  Undiagnosed lung disorder, currently chronic respiratory failure, 3 to 5 liters of oxygen by nasal cannula at baseline.   DISCHARGE MEDICATIONS: Gabapentin 100 mg 3 caps once a day, hydromorphone 4 mg 2 times a day, methadone 80 mg once a day, nortriptyline 50 mg once a day at bedtime, duloxetine 60 mg daily, hydroxyzine hydrochloride 25 mg 2 tabs once a day in the morning, metoprolol succinate 100 mg once a day, erythromycin 250 mg every 8 hours, omeprazole 40 mg daily, levothyroxine 50 mcg daily, Humulin 70/30 28 units 2 times a day, Ambien 5 mg once a day,  Augmentin 875/125 one tab every 12 hours for 7 days.   DIET: Low sodium, low fat, low cholesterol, ADA diet.   ACTIVITY: As tolerated.   FOLLOWUP: Please follow with your own podiatrist within 1 to 2 weeks. Please follow with PCP within a week for blood sugar monitoring and insulin adjustment.   LABORATORY, DIAGNOSTIC AND RADIOLOGICAL DATA:  Blood cultures from yesterday no growth to date x 2. White count of 4.8, hemoglobin is 10.8, platelets are 115. Initial sugar was 397, last sugar of 309, sodium 132. X-ray the left, AP and lateral joint, no plain film evidence of osteomyelitis, no soft tissue gas collection are demonstrated, minimum degenerative changes of interphalangeal joints, mild diffuse osteopenia.   HISTORY OF PRESENT ILLNESS AND HOSPITAL COURSE: For full details of H and P, please see  the dictation on February 2 by Dr. Clint Guy, but briefly this is a 44 year old with peripheral vascular disease, chronic respiratory failure, who comes in for 3 to 4 days' duration of foot pain, originally sharp, worse with standing and no relieving factors. The patient had noted some purulent, foul-smelling discharge and came into the hospital, was noted to have some cellulitis of the left foot, was started on antibiotics and podiatry was consulted. The patient had no significant fevers or leukocytosis nor significant SIRS criteria here. The patient was seen by Dr. Linus Galas from podiatry, who did local debridement and had applied some wet-to-dry dressing. Per Dr. Alberteen Spindle, the patient does not need any IV antibiotics and the recommendation was for Augmentin for the next week and outpatient followup with Dr. Al Corpus, her outpatient podiatrist, and was deemed stable for discharge with local wound care. She does have home health as an outpatient and she will be discharged with home health.   In regards to her uncontrolled diabetes, she states that her sugars are always in the 300s. She takes 25 units of 70/30 and I have increased that to 28 units and instructions about diet as well as following up with her PCP, Dr. Lorie Phenix, for further adjustment of the insulin as needed. At this point, she will be discharged with outpatient followup.   TOTAL TIME SPENT: 35 minutes.   CODE STATUS: The patient is full code.   ____________________________ Krystal Eaton, MD sa:cs D: 05/21/2013 14:07:52 ET T: 05/21/2013 14:18:36 ET JOB#: [id number]  cc:  Krystal EatonShayiq Caryssa Elzey, MD, <Dictator> Leo GrosserNancy J. Maloney, MD Marcelle SmilingSHAYIQ [facility] Health CenterHMADZIA MD ELECTRONICALLY SIGNED 06/01/2013 10:57

## 2014-08-09 NOTE — Consult Note (Signed)
PATIENT NAME:  [name] [name], [name] [name] MR#:  [id number] DATE OF BIRTH:  [birth year]  DATE OF CONSULTATION:  08/02/2013     CONSULTING PHYSICIAN:  Keturah Barre, NP  HISTORY OF PRESENT ILLNESS: I appreciate consult for 44 year old Caucasian woman with a history of chronic abdominal pain, respiratory failure, suspected gastroparesis, diabetes, DVT and complicated groin surgery, depression and anxiety for evaluation of nausea and vomiting, was admitted last September for same. Looked like she had a gastric outlet obstruction, was evaluated by Dr. Mechele Collin. At the time, it was felt like she was too ill to undergo endoscopic eval. Attempted gastric emptying test, the patient unable to complete, looks like she was treated empirically with some erythromycin. As she states Reglan gives her respiratory distress and omeprazole. She improved and was discontinued on proton pump inhibitor, was to follow up with Dr. Mechele Collin but did not. Of note, she is on gabapentin 80 mg p.o., methadone daily and takes dilaudid at least 4 mg on a daily basis for chronic abdominal leg pain. States she has never had GI workup or colonoscopy, EGD. States this episode of nausea and vomiting started 4 days ago with unknown triggers, accompanied by some dyspepsia. Denies abdominal pain, problems swallowing, constipation. Last BM 2 days ago. Last vomiting yesterday. No nausea currently. Methadone has been on hold since this admission. She states her chronic abdominal pain relates to a complicated hernia repair and then a complicated left groin surgery. Seems like the patient's chronic pain is more to the left groin and left lower quadrant when it occurs. No other GI complaints. Additionally, her TSH here is 50. Last September it was 0.37. The patient does state she is taking her levothyroxine on a daily basis.   PAST MEDICAL HISTORY: Chronic abdominal pain, diabetes, chronic respiratory failure likely due to COPD on chronic O2 at home,  hypothyroidism, chronic pain, pancytopenia, anxiety disorder, DVT, chronic leg pain, history of surgery in the groin complicated with a femoral artery tear, DVT filters, hysterectomy, melanoma resection of shoulder.   ALLERGIES: REGLAN, LOVENOX, IBUPROFEN.   HOME MEDICATIONS: Ambien 10 mg at bedtime, Cymbalta 60 mg p.o. daily, gabapentin 300 mg p.o. at bedtime, Humulin 70/30, Humulin R 12 units b.i.d. prior to meals, HCTZ/triamterene 27/325 one tab p.o. daily, hydromorphone 4 mg 1 tab p.o. daily, hydroxyzine 50 mg p.o. q. a.m., levothyroxine 150 mcg p.o. daily, lorazepam 0.5 mg t.i.d., metformin/Actos 850/15 mg 1 tab p.o. b.i.d. methadone 80 mg p.o. daily, metoprolol 100 mg p.o. daily, Prilosec 20 mg p.o. daily, vitamin B12 one tab p.o. daily, D3 one tab p.o. daily, Zofran 4 mg t.i.d. p.r.n.   SOCIAL HISTORY: No tobacco, illicits, alcohol, lives with family.   FAMILY HISTORY: Mother with diabetes. No known GI family history.  REVIEW OF SYSTEMS: Ten systems reviewed. She is currently more alert and currently it is negative other than what is written above.   LABORATORY DATA: Most recent labs: Glucose 20.2, BUN 11, creatinine 0.71. Sodium 133, potassium 3.3, chloride 97, GFR 77, lipase normal 04/16. ALP 150. Otherwise, her hepatic panel is normal. Prior PT/INR testing in September was normal. TSH 50.3, WBC 4.6, hemoglobin 11, hematocrit 34.8, platelet count 118, red cells macrocytic.   Abdominal x-ray revealing for stool in the colon. Did have a CT scan demonstrating multiple subcentimeter hypodensities, likely cysts. There is no ductal dilation. The gallbladder was normal. The spleen was large. There is a kidney cyst. Otherwise, this was a negative study. This scan was ordered on 04/14.  PHYSICAL EXAMINATION: VITAL SIGNS: Most recent: Temperature 99.1, pulse 95, respiratory rate 18, blood pressure 163/105.  GENERAL: Chronically ill-appearing woman in no acute distress resting in bed, appears  comfortable.  HEENT: Normocephalic, atraumatic. Sclerae are clear. There is no icterus.  NECK: Supple. No JVD, thyromegaly.  CHEST:  Lungs clear. Respirations eupneic.  CARDIOVASCULAR: S1, S2, RRR. No MRG. Trace generalized edema.  ABDOMEN: Nondistended. Bowel sounds x 4. Nontender. No guarding, rigidity, peritoneal signs, hepatosplenomegaly or other abnormalities.  SKIN: Warm, dry, sallow. No erythema, lesion or rash.  EXTREMITIES: MAEW x 4. Strength 5/5. PSYCHIATRIC:  Calm, relaxed.  NEUROLOGIC: Alert, oriented x 3. Cranial nerves intact. Speech clear. No facial droop.   IMPRESSION AND PLAN: Nausea, vomiting, possible abdominal pain. Do suspect her medications contribute greatly to this due to side effects of narcotics to potential narcotic bowel/gastrointestinal syndrome; however, she has no history of luminal evaluation. We will do serial films over the next couple of days and plan for EGD on Monday as clinically feasible. She should also have endocrine evaluation and follow up endocrine care as an outpatient.  Thank you very much for this consult.  These services were provided by Vevelyn Pathristiane Melady Chow, MSN, [facility], in collaboration with Christena DeemMartin U. Skulskie, MD, with whom I have discussed this patient in full.   ____________________________ Keturah Barrehristiane H. Coutney Wildermuth, NP chl:ce D: 08/02/2013 19:09:54 ET T: 08/02/2013 19:39:11 ET JOB#: [id number]  cc: Keturah Barrehristiane H. Markeis Allman, NP, <Dictator> Mike Berntsen H Dailin Sosnowski FNP ELECTRONICALLY SIGNED 09/03/2013 16:00

## 2014-08-09 NOTE — Consult Note (Signed)
PATIENT NAME:  [name] [name], [name] MR#:  [id number] DATE OF BIRTH:  1970/07/27  DATE OF CONSULTATION:  05/21/2013  REFERRING PHYSICIAN:   CONSULTING PHYSICIAN:  Linus Galasodd Tonie Vizcarrondo, DPM  REASON FOR CONSULTATION: This is a 44 year old female with a history of peripheral vascular disease status post bypass as well as diabetes who presented to the Emergency Department with increasing foot pain over the last few days. States she has had some foul smelling discharge from the wounds on her feet. She does normally see Dr. Al CorpusHyatt outpatient for her diabetic foot care. Was admitted for antibiotics and evaluation.   PAST MEDICAL HISTORY: Diabetes with associated neuropathy, peripheral vascular disease, hypertension, hypothyroidism, undiagnosed lung disorder, chronic respiratory failure.   PAST SURGICAL HISTORY: Status post bypass, lower extremities.   HOME MEDICATIONS: Hydromorphone 4 mg p.o. b.i.d., methadone 80 mg p.o. daily, gabapentin 300 mg p.o. at bedtime, duloxetine 60 mg p.o. daily, nortriptyline 50 mg p.o. at bedtime, Humulin 70/30 22 units b.i.d., hydroxyzine 25 mg 2 tablets p.o. daily, Lunesta 1 mg p.o. daily as needed for sleep, Ambien 10 mg p.o. daily as needed for sleep, metoprolol 100 mg extended release p.o. daily, erythromycin 250 mg p.o. q. 8 hours, Prilosec 40 mg p.o. daily, levothyroxine 50 mcg p.o. daily.   ALLERGIES: IBUPROFEN, LOVENOX, REGLAN.   FAMILY HISTORY: Diabetes.   SOCIAL HISTORY: No current tobacco use but history of previous tobacco abuse. Denies alcohol.   REVIEW OF SYSTEMS: Does relate some significant neuropathy in both lower extremities. Some drainage from the ulcerations on the plantar aspect of her feet. Chronic shortness of breath with her lung disease. Denies any chest pain. No dysurias or incontinence. Denies any stomach pain, heartburn, or nausea.   PHYSICAL EXAMINATION: VASCULAR: DP and PT pulses are fully palpable. Capillary filling time is intact.  NEUROLOGICAL:  There is loss of protective threshold with a monofilament wire bilateral. Absent proprioception.  INTEGUMENT: Skin is warm, dry, and mildly atrophic. Diminished hair growth. Toenails are all severely elongated, dystrophic x10. Large hemorrhagic, hyperkeratotic areas beneath the first metatarsal bilateral. Upon debridement there is underlying ulceration with some maceration, approximately 2.5 cm diameter. Both have a small area that goes a little bit deeper, less than 5 mm diameter. No gross infection is noted or cellulitis in the tissues.  MUSCULOSKELETAL: Stiff range of motion of the pedal joints. There is some plantar displacement of the first metatarsal bilateral. Muscle testing is deferred.   ASSESSMENT: 1.  Diabetes with associated neuropathy.  2.  Peripheral vascular disease.  3.  Bilateral ulcerations first metatarsal.   PLAN: Debrided devitalized tissue excisionally at the first metatarsal using 15 blade. Again, diameter was approximately 2.5 cm with superficial thickness down to the superficial tissues. A wet-to-dry dressing was applied. The patient was given instructions for daily antibiotic ointment and a light dressing. Recommend Augmentin for the next week. At this point, I think the patient should be stable for discharge with local wound care and follow up with her podiatrist outpatient, which is Dr. Al CorpusHyatt.  ____________________________ Linus Galasodd Minh Jasper, DPM tc:sb D: 05/21/2013 13:03:38 ET T: 05/21/2013 13:19:53 ET JOB#: [id number]  cc: Linus Galasodd Raeghan Demeter, DPM, <Dictator> M. Arbutus Pedodd Hyatt, [place] Dakota[place]PM Maryama Kuriakose DPM ELECTRONICALLY SIGNED 06/11/2013 9:55

## 2014-08-09 NOTE — Consult Note (Signed)
Chief Complaint:  Subjective/Chief Complaint seen for n/v abdominalpain.  doing better, mild nausea, she reports having a bm  last night, passing flatus.  no emesis, mild upper abdominal discomfort.   VITAL SIGNS/ANCILLARY NOTES: **Vital Signs.:   20-Apr-15 06:09  Vital Signs Type Routine  Celsius 36.8  Temperature Source oral  Pulse Pulse 69  Respirations Respirations 18  Systolic BP Systolic BP 155  Diastolic BP (mmHg) Diastolic BP (mmHg) 83  Mean BP 107  Pulse Ox % Pulse Ox % 97  Pulse Ox Activity Level  At rest  Oxygen Delivery 3L   Brief Assessment:  Cardiac Regular   Respiratory clear BS   Gastrointestinal details normal Soft  Nondistended  No masses palpable  Bowel sounds normal  minimal tnederness to p[alpation.   Lab Results: Routine Hem:  16-Apr-15 11:50   Platelet Count (CBC)  137  17-Apr-15 03:59   Platelet Count (CBC)  118   Radiology Results: XRay:    19-Apr-15 14:50, Abdomen Flat and Erect  Abdomen Flat and Erect   REASON FOR EXAM:    ileus/Obstruction? abd pain  COMMENTS:       PROCEDURE: DXR - DXR ABDOMEN 2 V FLAT AND ERECT  - Aug 04 2013  2:50PM     CLINICAL DATA:  44 year old female with abdominal pain. Initial  encounter.    EXAM:  ABDOMEN - 2 VIEW    COMPARISON:  08/03/2013 and earlier.    FINDINGS:  Retained oral contrast in bowel loops, primarily the colon. Moderate  to severe distension of the stomach now with both gas and fluid. No  dilated small bowel. No pneumoperitoneum. Stable lung bases. IVC  filter and left iliac vascular stent. Stable left lower quadrant  surgical clips. Stable visualized osseous structures.     IMPRESSION:  1. Moderate to severe distension of the stomach. Recommend NG tube  decompression.  2. Otherwise Non obstructed bowel gas pattern.  No free air.      Electronically Signed    By: Augusto GambleLee  Hall M.D.    On: 08/04/2013 15:56     Verified By: Kevan NyHAROLD L. HALL, M.D.,   Assessment/Plan:   Assessment/Plan:  Assessment 1) nausea vomiting and abdominal pain.  I ahve reviewed the xrays films in this hospitalization.  Yesterday, there was evidence of a large stomach with mild distension.  However patients abdomen has progressively improved over the course of admission, and today is minimally uncomfortablem soft, normal bs and not distended.  My impression is possible gastric atonia related to gastroparesis, multifactorial-DM and meds (pain meds/chronic use).   Plan 1)  egd today.  I have discussed the risks benefits and complicatiosn of egd to include not limited to bleeding infection perforationa nd sedation and she wishes to proceed.  further recs to follow.   Electronic Signatures: Barnetta ChapelSkulskie, Martin (MD)  (Signed 20-Apr-15 12:32)  Authored: Chief Complaint, VITAL SIGNS/ANCILLARY NOTES, Brief Assessment, Lab Results, Radiology Results, Assessment/Plan   Last Updated: 20-Apr-15 12:32 by Barnetta ChapelSkulskie, Martin (MD)

## 2014-08-09 NOTE — Consult Note (Signed)
PATIENT NAME:  [name] [name], [name] [name] MR#:  [id number] DATE OF BIRTH:  April 28, 1970  DATE OF ADMISSION:  08/01/2013 DATE OF CONSULTATION:  Wille Glaser04/20/2015  CONSULTING PHYSICIAN:  Quentin Orealph [name]. Ely III, MD   PRIMARY CARE PHYSICIAN: Dr. Lorie PhenixNancy Maloney.   ADMITTING PHYSICIAN:  [facility].   CHIEF COMPLAINT: Nausea and vomiting.   BRIEF HISTORY: [name] [name] is a [age] year old woman admitted to the hospital with intractable nausea and vomiting. She had an ED evaluation on the 14th of April, which did not demonstrate any significant abdominal pathology. She was discharged from the Emergency Room, continued to have symptoms over the next 48 hours. With her marked nausea and vomiting, she was admitted to the Emergency Room for further evaluation and possible EGD.   She had a similar episode in September 2014. She had the same type of symptoms; thought to have possible gastroparesis related to her severe diabetes with very poor control. At that time, CT scan was performed. No significant abnormalities were identified. She underwent a gastric emptying study, which was inconclusive, as she was unable to complete the test, by her history. She had no problems in the interim after being given erythromycin in order to stimulate her gastric function. She has not had an EGD until today.   Surgical service was consulted to assist in the management. EGD today did not demonstrate any evidence of significant gastric outlet obstruction, but did demonstrate moderate gastritis.   She has no history of abdominal surgery, other than hysterectomy and a C-section. She has not had any history of hepatitis, yellow jaundice, pancreatitis, peptic ulcer disease, gallbladder disease or diverticulitis. She has a history of chronic obstructive lung disease, hypothyroidism, chronic pain syndrome and severe deep venous thrombosis. [facility] which was complicated by femoral  vessel injury prompting urgent repair, complicated by significant DVT. She was placed on Lovenox, developed internal bleeding and required vena cava filter.   MEDICATIONS: Well outlined in her current note.   PHYSICAL EXAMINATION: GENERAL: She is alert, a little lethargic from her medication, but comfortable, complaining of no significant abdominal pain, but her chronic groin pain.  HEENT: Unremarkable. She has no scleral icterus. No pupillary abnormalities.  NECK: Supple, nontender with no adenopathy, and she has midline trachea.  CHEST: Clear with normal pulmonary excursion. No adventitious sounds.  CARDIAC: Reveals no murmurs or gallops to my ear. She has been in normal rhythm.  ABDOMEN: Mildly distended with no significant tenderness, rebound, guarding, masses or hernias. She does have a huge scar in her left groin. She also has multiple distended veins over her abdominal wall consistent with her known deep venous thrombosis.  EXTREMITIES: Lower extremity exam reveals mild edema. Full range of motion.   I have independently reviewed her CT scan. The scan suggests normal gastric function without evidence of significant obstruction. Contrast has passed on the followup x-rays to her colon. There does not appear to be any obvious obstruction. There did not appear to be any evidence of intra-abdominal infection.   With EGD findings, CT findings and her history, I would suggest her primary diagnosis here is gastroparesis. I do not see any surgical indications. At the present time, I would not recommend any surgical intervention.    ____________________________ Quentin Orealph [name]. Ely III, MD rle:dmm D: 08/05/2013 20:27:15 ET T: 08/05/2013 21:23:52 ET JOB#: [id number]  cc: Carmie Endalph [name]. Ely III, MD, <Dictator> Leo GrosserNancy J. Maloney, MD Quentin OreALPH [name] ELY MD ELECTRONICALLY SIGNED 08/05/2013 23:28

## 2014-08-09 NOTE — Consult Note (Signed)
Brief Consult Note: Diagnosis: NV.   Patient was seen by consultant.   Consult note dictated.   Comments: Appreciate consult for 44 y/o caucasian woman with hx of  chronic abdominal pain, respiratory failure, suspected gastroparesis DM, DVT, complicated groin surgery, depression/anxiety, for evaluation of NV. She was admitted last September for same and it looked like she had a gastric outlet obstruction and was evaluated by Dr Markham JordanElliot- at the time it was felt she was too ill to undergo endoscopic evaluation- attempted gastric emptying test but pt unable to complete: looks like she was treated empirically with some erythromycin, as she states reglan gives her resp distress,  and Omeprazole-she improved and was dc'd on PPI, was to fu with Dr Markham JordanElliot but did not Of note she is on gabapentin,  80mg  po methadone daily, and takes dilaudid on a daily basis for chronic abd/leg pain. States she has never had GI work up or colonoscopy/EGD. States this episode of NV started 4d ago with unknown triggers. Accompanied with some dyspepsia. Denies abdominal pain, problems swallowing, and constipation.  Last BM 2d ago.Last vomit yesterday, no nausea currently: methadone has been on hold since admission.   States her chronic abdominal pain relates to a complicated hernia repair and a complicated left groin surgery. Seems like the patient's chronic pain is more to the left groin/llq when it occurs. No other GI complaints. Additionally, her TSH here is 2650- last Sep was 0.37. Pt does state she is taking her levothroxine daily.  Impression and plan: NV-  suspect her medications contribute greatly to this. However no history of luminal evaluation: will do serial films over the next couple of days and plan for EGD on Monday as clinically feasible. Should also have endocrine evaluation and fu endocrine care as o/p.Marland Kitchen.  Electronic Signatures: Vevelyn PatLondon, Margrett Kalb H (NP)  (Signed 17-Apr-15 17:34)  Authored: Brief Consult  Note   Last Updated: 17-Apr-15 17:34 by Keturah BarreLondon, Janesa Dockery H (NP)

## 2014-08-21 ENCOUNTER — Encounter: Payer: Medicare PPO | Attending: Surgery | Admitting: Surgery

## 2014-08-21 DIAGNOSIS — E11621 Type 2 diabetes mellitus with foot ulcer: Secondary | ICD-10-CM | POA: Diagnosis not present

## 2014-08-21 DIAGNOSIS — L97522 Non-pressure chronic ulcer of other part of left foot with fat layer exposed: Secondary | ICD-10-CM | POA: Insufficient documentation

## 2014-08-21 DIAGNOSIS — M86671 Other chronic osteomyelitis, right ankle and foot: Secondary | ICD-10-CM | POA: Diagnosis not present

## 2014-08-21 DIAGNOSIS — L97512 Non-pressure chronic ulcer of other part of right foot with fat layer exposed: Secondary | ICD-10-CM | POA: Diagnosis not present

## 2014-08-22 NOTE — Progress Notes (Signed)
Macintyre, [name] ([id number]) Visit Report for 08/21/2014 Chief Complaint Document Details Audi, Maureen RalphsVIVIAN Date of Service: 08/21/2014 1:45 PM Patient Name: L. Patient Account Number: [id number] Medical Record Clover Mealyfful, RN, BSN, [id number] Treating RN: Number: [place] Sinkita Date of Birth/Sex: Mar 15, 1971 (44 y.o. Female) Other Clinician: Primary Care Physician: PATIENT, NO Treating Djimon Lundstrom Referring Physician: Physician/Extender: Tania AdeWeeks in Treatment: 31 Information Obtained from: Patient Chief Complaint She has wounds both feet plantar aspect over the 1st metatarsals. She and her husband say she does not walk much and she does not lay her foot against a hard surface. Most of the time she is on the bed. She has seen podiatry and has diabetic shoes. Her sugar is better controlled at present. no pain and no discharge from the wounds. to last week's discussion she says she has been in bed almost the entire weekend and has not been on her feet as much. She is also wearing her special Darco shoes. She is also been very compliant with her diabetic control. Electronic Signature(s) Signed: 08/21/2014 4:59:19 PM By: Evlyn KannerBritto, Makenli Derstine MD, FACS Entered By: Evlyn KannerBritto, Vittoria Noreen on 08/21/2014 14:58:11 Blackwell, [name] ([id number]) -------------------------------------------------------------------------------- Debridement Details Leon, Maureen RalphsVIVIAN Date of Service: 08/21/2014 1:45 PM Patient Name: L. Patient Account Number: [id number] Medical Record Clover Mealyfful, RN, BSN, [id number] Treating RN: Number: [place] Sinkita Date of Birth/Sex: Mar 15, 1971 (44 y.o. Female) Other Clinician: Primary Care Physician: PATIENT, NO Treating Kallen Mccrystal Referring Physician: Physician/Extender: Tania AdeWeeks in Treatment: 31 Debridement Performed for Wound #1R Left,Plantar Foot Assessment: Performed By: Physician Tristan SchroederBritto, Kayvion Arneson J., MD Debridement: Open Wound/Selective Debridement Selective Description: Pre-procedure  Yes Verification/Time Out Taken: Start Time: 14:41 Pain Control: Lidocaine 4% Topical Solution Level: Non-Viable Tissue Total Area Debrided (L x 1 (cm) x 1 (cm) = 1 (cm) W): Tissue and other Non-Viable, Callus material debrided: Instrument: Curette Bleeding: Minimum Hemostasis Achieved: Pressure End Time: 14:46 Procedural Pain: 0 Post Procedural Pain: 0 Response to Treatment: Procedure was tolerated well Post Debridement Measurements of Total Wound Length: (cm) 0.1 Width: (cm) 0.1 Depth: (cm) 0.1 Volume: (cm) 0.001 Electronic Signature(s) Signed: 08/21/2014 4:59:19 PM By: Evlyn KannerBritto, Taleigh Gero MD, FACS Signed: 08/21/2014 5:21:09 PM By: Elpidio EricAfful, Rita BSN, RN Entered By: Evlyn KannerBritto, Jahmia Berrett on 08/21/2014 14:56:28 [place], [name] ([id number]) -------------------------------------------------------------------------------- Debridement Details Naef, Maureen RalphsVIVIAN Date of Service: 08/21/2014 1:45 PM Patient Name: L. Patient Account Number: [id number] Medical Record Clover Mealyfful, RN, BSN, [id number] Treating RN: Number: [place] Sinkita Date of Birth/Sex: Mar 15, 1971 (44 y.o. Female) Other Clinician: Primary Care Physician: PATIENT, NO Treating Joy Reiger Referring Physician: Physician/Extender: Tania AdeWeeks in Treatment: 31 Debridement Performed for Wound #6 Right,Plantar Metatarsal head first Assessment: Performed By: Physician Tristan SchroederBritto, Tahari Clabaugh J., MD Debridement: Open Wound/Selective Debridement Selective Description: Pre-procedure Yes Verification/Time Out Taken: Start Time: 14:46 Pain Control: Lidocaine 4% Topical Solution Level: Non-Viable Tissue Total Area Debrided (L x 2 (cm) x 1 (cm) = 2 (cm) W): Tissue and other Non-Viable, Callus, Eschar, Fibrin/Slough, Skin material debrided: Instrument: Curette Bleeding: Minimum Hemostasis Achieved: Pressure End Time: 14:50 Procedural Pain: 0 Post Procedural Pain: 0 Response to Treatment: Procedure was tolerated well Post Debridement Measurements of  Total Wound Length: (cm) 0.3 Width: (cm) 1 Depth: (cm) 0.3 Volume: (cm) 0.071 Electronic Signature(s) Signed: 08/21/2014 4:59:19 PM By: Evlyn KannerBritto, Lynard Postlewait MD, FACS Signed: 08/21/2014 5:21:09 PM By: Elpidio EricAfful, Rita BSN, RN Entered By: Evlyn KannerBritto, Keoni Havey on 08/21/2014 14:57:49 [place], [name] ([id number]) -------------------------------------------------------------------------------- HPI Details Carmicheal, Maureen RalphsVIVIAN Date of Service: 08/21/2014 1:45 PM Patient Name: L. Patient Account Number: [id number] Medical Record Clover Mealyfful, RN, BSN, [id number] Treating RN: Number: [place] Sinkita Date of  Birth/Sex: May 08, 1970 (43 y.o. Female) Other Clinician: Primary Care Physician: PATIENT, NO Treating Tiarrah Saville Referring Physician: Physician/Extender: Tania Ade in Treatment: 31 History of Present Illness HPI Description: Shellby is a 57F w/ h/o DM who presents with wounds on B/L feet. She states that she has the wounds for months (she first presented in 01/13/14). She has had them debrided and had been dressing them with silvadene at the time or presentation. Her sugars are usually in the low 200s. She has an upcoming appointment to draw a HgBA1C. She has a h/o L femoral artery injury during a hernia repair (2003). The wound closure at that time required a muscle flap and post-op HBO treatment. We got an MRI of her right footwhich was worrisome for soft tissue infection as well as adjacent osteomyelitis. This is in regards to soft tissue ulceration medial to the first metatarsal head. A a subsequent bone scan of the right foot also confirm these results. The patient is here for followup today.she denies any fevers at home. she is compliant with her dressing changes. She recently had myryngotomy tubes placed in her R ear. later she also had a myringotomy tube placed in the left ear. On careful questioning she says she does not walk around the house much at all. In the day she may be awake for about an hour and during the  night she may be awake for about 2 or 3 hours and she does walk around at that time. 06/12/14 -- since I had a detailed discussion with them last week and now ground rules about how she should be walking with a Darco shoes and how we are she should be on her feet she and her husband say that they have been very compliant. sugars were often high for a while but I believe they change the machine and it is much better now. overall she is doing much better than last week. 06/19/14 - she has been doing very well staying off her feet and has no fresh issues. 07/10/2014 the patient and her daughter say that she is being very compliant with not having her feet on the ground without her offloading shoes and she is not walking around too much. Her blood sugars also been running well. 06/19/14 -- her husband estimates that she is on her feet only about an hour a day and other than that she is always in either a recumbent position of her feet off the ground. Doing fine otherwise and has no fresh issues. 06/26/14 -- she continues to stay off her feet and has for her blood sugar goes she says she has not had any recent hemoglobin A1c done. 07/04/2014 -- a few days ago her sugar went up very high and she had to be seen in the emergency room and it had to be taken care of. This may have been due to an inadvertent skipping of her insulin dose. other than that she is now back to her baseline. [place]lock, [name] ([id number]) 07/24/2014 -- the patient has not come to the wound center for several days now and she has missed several of her HBO treatments. He had asked her to see her PCP but she has not done so yet. She says she's been febrile and has had shivering and body ache. Does say she's been off her feet. 07/31/2014 -- he has gone to her PCP and they have cut her pain medications in half and they have also not she is hypothyroid and given her some  medications and blood tests for this. she says she's  feeling much better now. She says she's been compliant with her dark offloading shoes and she would like to take her last 4 treatments next week for hyperbaric oxygen therapy. 08/14/2014 -- she had a injury to her right foot when she tripped over the edge of the bed and says she had her offloading shoes on but I doubt because the site of the injury is not in keeping with her having her offloading shoe on. the patient generally has not been very compliant with all instructions. 08/21/2014 -- her husband is with her today and in the course of conversation it has been clearly demonstrated that the patient does not use her offloading shoes at all while she is at home. She walks with her bandages and has no footwear whatsoever. This is the first time the truth has come out because all along their been telling me that they are using the Darco offloading shoes form morning till night. he has also said that her sugars are running between 190-200 and I have again told them that this needs to be much better controlled as they have not been following up with their PCP regularly. Electronic Signature(s) Signed: 08/21/2014 4:59:19 PM By: Evlyn Kanner MD, FACS Entered By: Evlyn Kanner on 08/21/2014 15:00:11 Chavers, [name] ([id number]) -------------------------------------------------------------------------------- Physical Exam Details Pineda, Maureen Ralphs Date of Service: 08/21/2014 1:45 PM Patient Name: L. Patient Account Number: [id number] Medical Record Clover Mealy, RN, BSN, [id number] Treating RN: Number: [place] Sink Date of Birth/Sex: [birth year] (45 y.o. Female) Other Clinician: Primary Care Physician: PATIENT, NO Treating Caden Fukushima Referring Physician: Physician/Extender: Tania Ade in Treatment: 31 Constitutional . Pulse regular. Respirations normal and unlabored. Afebrile. . Eyes Nonicteric. Reactive to light. Ears, Nose, Mouth, and Throat Lips, teeth, and gums WNL.Marland Kitchen Moist mucosa without lesions  . Neck supple and nontender. No palpable supraclavicular or cervical adenopathy. Normal sized without goiter. Respiratory WNL. No retractions.. Cardiovascular Pedal Pulses WNL. No clubbing, cyanosis or edema. Integumentary (Hair, Skin) the left plantar aspect is almost healed. The right plantar aspect has quite a bit of callus and this will be sharply debrided.Marland Kitchen Psychiatric Judgement and insight Intact.. No evidence of depression, anxiety, or agitation.. Electronic Signature(s) Signed: 08/21/2014 4:59:19 PM By: Evlyn Kanner MD, FACS Entered By: Evlyn Kanner on 08/21/2014 15:00:56 Harner, [name] ([id number]) -------------------------------------------------------------------------------- Physician Orders Details Patient Name: Diego, [name] Date of Service: 08/21/2014 1:45 PM Medical Record Number: [id number] Patient Account Number: [id number] Date of Birth/Sex: June 17, 1970 (44 y.o. Female) Treating RN: Curtis Sites Primary Care Physician: PATIENT, NO Other Clinician: Referring Physician: Treating Physician/Extender: Rudene Re in Treatment: 61 Verbal / Phone Orders: Yes Clinician: Curtis Sites Read Back and Verified: Yes Diagnosis Coding Wound Cleansing Wound #1R Left,Plantar Foot o Clean wound with Normal Saline. Wound #6 Right,Plantar Metatarsal head first o Clean wound with Normal Saline. Anesthetic Wound #1R Left,Plantar Foot o Topical Lidocaine 4% cream applied to wound bed prior to debridement Wound #6 Right,Plantar Metatarsal head first o Topical Lidocaine 4% cream applied to wound bed prior to debridement Primary Wound Dressing Wound #1R Left,Plantar Foot o Aquacel Ag o Other: - felt Wound #6 Right,Plantar Metatarsal head first o Aquacel Ag o Other: - felt Secondary Dressing Wound #1R Left,Plantar Foot o ABD and Kerlix/Conform Wound #6 Right,Plantar Metatarsal head first o ABD and Kerlix/Conform Dressing Change  Frequency Wound #1R Left,Plantar Foot o Change dressing every other day. Wound #6 Right,Plantar Metatarsal head first o Change dressing every other day. Smeltzer,  [name] ([id number]) Follow-up Appointments Wound #1R Left,Plantar Foot o Return Appointment in 1 week. Wound #6 Right,Plantar Metatarsal head first o Return Appointment in 1 week. Off-Loading Wound #1R Left,Plantar Foot o Other: - darco Front Loading with peg assist Wound #6 Right,Plantar Metatarsal head first o Other: - darco Front Loading with peg assist Additional Orders / Instructions Wound #1R Left,Plantar Foot o Other: - Keep pressure off of feet Wound #6 Right,Plantar Metatarsal head first o Other: - Keep pressure off of feet Electronic Signature(s) Signed: 08/21/2014 4:59:19 PM By: Evlyn Kanner MD, FACS Signed: 08/21/2014 5:45:27 PM By: Curtis Sites Entered By: Curtis Sites on 08/21/2014 14:52:04 Mehler, [name] ([id number]) -------------------------------------------------------------------------------- Problem List Details Murphey, Maureen Ralphs Date of Service: 08/21/2014 1:45 PM Patient Name: L. Patient Account Number: [id number] Medical Record Clover Mealy, RN, BSN, [id number] Treating RN: Number: [place] Sink Date of Birth/Sex: August 14, 1970 (44 y.o. 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 Patient Account Number: [id number] Medical Record Clover Mealy, RN, BSN, [id number] Treating RN: Number: [place] Sink Date of Birth/Sex: [birth year] (44 y.o. Female) Other Clinician: Primary Care Physician: PATIENT, NO Treating Kindred Heying Referring Physician: Physician/Extender: Tania Ade in Treatment: 31 Subjective Chief Complaint Information obtained from Patient She has wounds both feet plantar aspect over the 1st metatarsals. She and her husband say she does not walk much and she does not lay her foot against a hard surface. Most of the time she is on the bed. She has seen podiatry and has diabetic shoes. Her sugar is better controlled at present. no pain and no discharge from the wounds. to last week's discussion she says she has been in bed almost the entire weekend and has not been on her feet as much. She is also wearing her special Darco shoes. She is also been very compliant with her diabetic control. History of Present Illness (HPI) Jacqualine is a 28F w/ h/o DM who presents with wounds on B/L feet. She states that she has the wounds for months (she first presented in 01/13/14). She has had them debrided and had been dressing them with silvadene at the time or presentation. Her sugars are usually in the low 200s. She has an upcoming appointment to draw a HgBA1C. She has a h/o L femoral artery injury during a hernia repair (2003). The wound closure at that time  required a muscle flap and post-op HBO treatment. We got an MRI of her right footwhich was worrisome for soft tissue infection as well as adjacent osteomyelitis. This is in regards to soft tissue ulceration medial to the first metatarsal head. A a subsequent bone scan of the right foot also confirm these results. The patient is here  for followup today.she denies any fevers at home. she is compliant with her dressing changes. She recently had myryngotomy tubes placed in her R ear. later she also had a myringotomy tube placed in the left ear. On careful questioning she says she does not walk around the house much at all. In the day she may be awake for about an hour and during the night she may be awake for about 2 or 3 hours and she does walk around at that time. 06/12/14 -- since I had a detailed discussion with them last week and now ground rules about how she should be walking with a Darco shoes and how we are she should be on her feet she and her husband say that they have been very compliant. sugars were often high for a while but I believe they change the machine and it is much better now. overall she is doing much better than last week. 06/19/14 - she has been doing very well staying off her feet and has no fresh issues. 07/10/2014 the patient and her daughter say that she is being very compliant with not having her feet on the ground without her offloading shoes and she is not walking around too much. Her blood sugars also been Smither, Kamorah L. ([id number]) running well. 06/19/14 -- her husband estimates that she is on her feet only about an hour a day and other than that she is always in either a recumbent position of her feet off the ground. Doing fine otherwise and has no fresh issues. 06/26/14 -- she continues to stay off her feet and has for her blood sugar goes she says she has not had any recent hemoglobin A1c done. 07/04/2014 -- a few days ago her sugar went up very high and  she had to be seen in the emergency room and it had to be taken care of. This may have been due to an inadvertent skipping of her insulin dose. other than that she is now back to her baseline. 07/24/2014 -- the patient has not come to the wound center for several days now and she has missed several of her HBO treatments. He had asked her to see her PCP but she has not done so yet. She says she's been febrile and has had shivering and body ache. Does say she's been off her feet. 07/31/2014 -- he has gone to her PCP and they have cut her pain medications in half and they have also not she is hypothyroid and given her some medications and blood tests for this. she says she's feeling much better now. She says she's been compliant with her dark offloading shoes and she would like to take her last 4 treatments next week for hyperbaric oxygen therapy. 08/14/2014 -- she had a injury to her right foot when she tripped over the edge of the bed and says she had her offloading shoes on but I doubt because the site of the injury is not in keeping with her having her offloading shoe on. the patient generally has not been very compliant with all instructions. 08/21/2014 -- her husband is with her today and in the course of conversation it has been clearly demonstrated that the patient does not use her offloading shoes at all while she is at home. She walks with her bandages and has no footwear whatsoever. This is the first time the truth has come out because all along their been telling me that they are using the Darco offloading shoes  form morning till night. he has also said that her sugars are running between 190-200 and I have again told them that this needs to be much better controlled as they have not been following up with their PCP regularly. Objective Constitutional Pulse regular. Respirations normal and unlabored. Afebrile. Vitals Time Taken: 2:17 PM, Height: 76 in, Weight: 132 lbs, BMI: 16.1,  Temperature: 98.6 F, Pulse: 100 bpm, Respiratory Rate: 18 breaths/min, Blood Pressure: 128/90 mmHg. Eyes Nonicteric. Reactive to light. Ears, Nose, Mouth, and Throat Troost, Berdell L. ([id number]) Lips, teeth, and gums WNL.Marland Kitchen Moist mucosa without lesions . Neck supple and nontender. No palpable supraclavicular or cervical adenopathy. Normal sized without goiter. Respiratory WNL. No retractions.. Cardiovascular Pedal Pulses WNL. No clubbing, cyanosis or edema. Psychiatric Judgement and insight Intact.. No evidence of depression, anxiety, or agitation.. Integumentary (Hair, Skin) the left plantar aspect is almost healed. The right plantar aspect has quite a bit of callus and this will be sharply debrided.. Wound #1R status is Open. Original cause of wound was Gradually Appeared. The wound is located on the Left,Plantar Foot. The wound measures 0.1cm length x 0.1cm width x 0.1cm depth; 0.008cm^2 area and 0.001cm^3 volume. The wound is limited to skin breakdown. There is no tunneling or undermining noted. There is a none present amount of drainage noted. The wound margin is flat and intact. There is no granulation within the wound bed. There is a small (1-33%) amount of necrotic tissue within the wound bed including Adherent Slough. The periwound skin appearance exhibited: Callus, Dry/Scaly. The periwound skin appearance did not exhibit: Crepitus, Excoriation, Fluctuance, Friable, Induration, Localized Edema, Rash, Scarring, Maceration, Moist, Atrophie Blanche, Cyanosis, Ecchymosis, Hemosiderin Staining, Mottled, Pallor, Rubor, Erythema. Periwound temperature was noted as No Abnormality. Wound #6 status is Open. Original cause of wound was Gradually Appeared. The wound is located on the Right,Plantar Metatarsal head first. The wound measures 0.3cm length x 1cm width x 0.3cm depth; 0.236cm^2 area and 0.071cm^3 volume. The wound is limited to skin breakdown. There is no tunneling  or undermining noted. There is a small amount of serosanguineous drainage noted. The wound margin is flat and intact. There is medium (34-66%) red granulation within the wound bed. There is a small (1-33%) amount of necrotic tissue within the wound bed including Adherent Slough. The periwound skin appearance exhibited: Callus, Dry/Scaly, Moist. The periwound skin appearance did not exhibit: Crepitus, Excoriation, Fluctuance, Friable, Induration, Localized Edema, Rash, Scarring, Maceration, Atrophie Blanche, Cyanosis, Ecchymosis, Hemosiderin Staining, Mottled, Pallor, Rubor, Erythema. Periwound temperature was noted as No Abnormality. The periwound has tenderness on palpation. Assessment Active Problems ICD-9 892.1 - Open Wound - Foot except toes alone - complicated Fuente, Shadaya L. 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 Today it has been clearly established that the patient is not keeping her blood glucose levels down to normal range and that she has been totally noncompliant with wearing her offloading shoe. I had a long discussion with the patient and husband was at the bedside and have reiterated that she needs to use her  offloading shoes on a regular basis from morning right up to the night as long as she is walking on them. They agreed to be more compliant and will come back and see me next week. Procedures Wound #1R Wound #1R is a Diabetic Wound/Ulcer of the Lower Extremity located on  the Left,Plantar Foot . There was a Non-Viable Tissue Open Wound/Selective [phone]) debridement with total area of 1 sq cm performed by Jameel Quant, Ignacia Felling., MD. with the following instrument(s): Curette to remove Non-Viable tissue/material including Callus after achieving pain control using Lidocaine 4% Topical Solution. A time out was conducted prior to the start of the procedure. A Minimum amount of bleeding was controlled with Pressure. The procedure was tolerated well with a pain level of 0 throughout and a pain level of 0 following the procedure. Post Debridement Measurements: 0.1cm length x 0.1cm width x 0.1cm depth; 0.001cm^3 volume. 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 There was a Non-Viable Tissue Open Wound/Selective [phone]) debridement with total area of 2 sq cm performed by Valmore Arabie, Ignacia Felling., MD. with the following instrument(s): Curette to remove Non-Viable tissue/material including Fibrin/Slough, Eschar, Skin, and Callus after achieving pain control using Lidocaine 4% Topical Solution. A time out was conducted prior to the start of the procedure. A Minimum amount of bleeding was controlled with Pressure. The procedure was tolerated well with a pain level of 0 throughout and a pain level of 0 following the procedure. Post Debridement Measurements: 0.3cm length x 1cm width x 0.3cm depth; 0.071cm^3 volume. Vandevender, [name] ([id number]) Plan Wound Cleansing: Wound #1R Left,Plantar Foot: Clean wound with Normal Saline. Wound #6 Right,Plantar Metatarsal head first: Clean wound with Normal Saline. Anesthetic: Wound #1R Left,Plantar Foot: Topical Lidocaine 4% cream applied to wound bed prior to debridement Wound #6 Right,Plantar Metatarsal head first: Topical Lidocaine 4% cream applied to wound bed prior to debridement Primary Wound Dressing: Wound #1R Left,Plantar Foot: Aquacel  Ag Other: - felt Wound #6 Right,Plantar Metatarsal head first: Aquacel Ag Other: - felt Secondary Dressing: Wound #1R Left,Plantar Foot: ABD and Kerlix/Conform Wound #6 Right,Plantar Metatarsal head first: ABD and Kerlix/Conform Dressing Change Frequency: Wound #1R Left,Plantar Foot: Change dressing every other day. Wound #6 Right,Plantar Metatarsal head first: Change dressing every other day. Follow-up Appointments: Wound #1R Left,Plantar Foot: Return Appointment in 1 week. Wound #6 Right,Plantar Metatarsal head first: Return Appointment in 1 week. Off-Loading: Wound #1R Left,Plantar Foot: Other: - darco Front Loading with peg assist Wound #6 Right,Plantar Metatarsal head first: Other: - darco Front Loading with peg assist Additional Orders / Instructions: Wound #1R Left,Plantar Foot: Other: - Keep pressure off of feet Wound #6 Right,Plantar Metatarsal head first: Other: - Keep pressure off of feet Quade, Persephanie L. ([id number]) The patient and her family dynamic has been such that it's difficult to get a clear answer about anything about how compliant she has been at home. Today it has been clearly established that the patient is not keeping her blood glucose levels down to normal range and that she has been totally noncompliant with wearing her offloading shoe. I had a long discussion with the patient and husband was at the bedside and have reiterated that she needs to use her offloading shoes on a regular basis from morning right up to the night as long as she is walking on them. They agreed to be more compliant and will come back and see me next week. Electronic Signature(s)  Signed: 08/21/2014 4:59:19 PM By: Evlyn Kanner MD, FACS Entered By: Evlyn Kanner on 08/21/2014 15:03:34

## 2014-08-22 NOTE — Progress Notes (Signed)
Sevigny, [name] [name] ([id number]) Visit Report for 08/21/2014 Arrival Information Details Patient Name: [name] [name], [name] [name]. Date of Service: 08/21/2014 1:45 PM Medical Record Number: [id number] Patient Account Number: [id number] Date of Birth/Sex: May 17, 1970 (44 y.o. Female) Treating RN: Afful, RN, BSN, [employer] Primary Care Physician: PATIENT, NO Other Clinician: Referring Physician: Treating Physician/Extender: Rudene Re in Treatment: 31 Visit Information History Since Last Visit Any new allergies or adverse reactions: No Patient Arrived: Ambulatory Had a fall or experienced change in No Arrival Time: 14:08 activities of daily living that may affect Accompanied By: hubby risk of falls: Transfer Assistance: None Signs or symptoms of abuse/neglect since last No Patient Identification Verified: Yes visito Secondary Verification Process Yes Hospitalized since last visit: No Completed: Has Dressing in Place as Prescribed: Yes Patient Requires Transmission-Based No Has Footwear/Offloading in Place as Yes Precautions: Prescribed: Patient Has Alerts: Yes Left: Wedge Shoe Right: Wedge Shoe Pain Present Now: No Electronic Signature(s) Signed: 08/21/2014 5:21:09 PM By: Elpidio Eric BSN, RN Entered By: Elpidio Eric on 08/21/2014 14:09:07 Freels, [name] ([id number]) -------------------------------------------------------------------------------- Encounter Discharge Information Details Patient Name: [name], [name] Date of Service: 08/21/2014 1:45 PM Medical Record Number: [id number] Patient Account Number: [id number] Date of Birth/Sex: 03-30-1971 (44 y.o. Female) Treating RN: Curtis Sites Primary Care Physician: PATIENT, NO Other Clinician: Referring Physician: Treating Physician/Extender: Rudene Re in Treatment: 48 Encounter Discharge Information Items Discharge Pain Level: 0 Discharge Condition: Stable Ambulatory Status: Ambulatory Discharge  Destination: Home Transportation: Private Auto Accompanied By: spouse Schedule Follow-up Appointment: Yes Medication Reconciliation completed and provided to Patient/Care No Jonesha Tsuchiya: Provided on Clinical Summary of Care: 08/21/2014 Form Type Recipient Paper Patient [facility] Electronic Signature(s) Signed: 08/21/2014 3:12:19 PM By: Gwenlyn Perking Entered By: Gwenlyn Perking on 08/21/2014 15:12:19 Mcaffee, [name] ([id number]) -------------------------------------------------------------------------------- Lower Extremity Assessment Details Patient Name: [name], [name] Date of Service: 08/21/2014 1:45 PM Medical Record Number: [id number] Patient Account Number: [id number] Date of Birth/Sex: [birth year] (44 y.o. Female) Treating RN: Afful, RN, BSN, Psychologist, clinical Primary Care Physician: PATIENT, NO Other Clinician: Referring Physician: Treating Physician/Extender: Rudene Re in Treatment: 31 Vascular Assessment Pulses: Posterior Tibial Dorsalis Pedis Palpable: [Left:Yes] [Right:Yes] Extremity colors, hair growth, and conditions: Extremity Color: [Left:Mottled] [Right:Mottled] Hair Growth on Extremity: [Left:No] [Right:No] Temperature of Extremity: [Left:Warm] [Right:Warm] Capillary Refill: [Left:< 3 seconds] [Right:< 3 seconds] Dependent Rubor: [Left:No] [Right:No] Blanched when Elevated: [Left:No] [Right:No] Lipodermatosclerosis: [Left:No] [Right:No] Toe Nail Assessment Left: Right: Thick: No Discolored: No No Deformed: No No Improper Length and Hygiene: Yes Yes Electronic Signature(s) Signed: 08/21/2014 5:21:09 PM By: Elpidio Eric BSN, RN Entered By: Elpidio Eric on 08/21/2014 14:17:39 Reis, [name] ([id number]) -------------------------------------------------------------------------------- Multi Wound Chart Details Patient Name: [name], [name] Date of Service: 08/21/2014 1:45 PM Medical Record Number: [id number] Patient Account Number: [id number] Date of  Birth/Sex: 1970/06/29 (44 y.o. 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] [6:the Lower Extremity] Comorbid History: [1R:Cataracts, Chronic Obstructive Pulmonary Disease (COPD), Type II Disease (COPD), Type II Diabetes] [6:Cataracts, Chronic Obstructive Pulmonary Diabetes] [N/A:N/A] Date Acquired: [1R:10/28/2013] [6:02/09/2014] [N/A:N/A] Weeks of Treatment: [1R:31] [6:24] [N/A:N/A] Wound Status: [1R:Open] [  6:Open] [N/A:N/A] Wound Recurrence: [1R:Yes] [6:No] [N/A:N/A] Measurements L x W x D 0.1x0.1x0.1 [6:0.3x1x0.3] [N/A:N/A] (cm) Area (cm) : [1R:0.008] [6:0.236] [N/A:N/A] Volume (cm) : [1R:0.001] [6:0.071] [N/A:N/A] % Reduction in Area: [1R:99.90%] [6:90.70%] [N/A:N/A] % Reduction in Volume: 99.90% [6:71.90%] [N/A:N/A] Classification: [1R:Grade 2] [6:Grade 3] [N/A:N/A] Wagner Verification: [1R:N/A] [6:MRI] [N/A:N/A] Exudate Amount: [1R:None Present] [6:Small] [N/A:N/A] Exudate Type: [1R:N/A] [6:Serosanguineous] [N/A:N/A] Exudate Color: [1R:N/A] [6:red, brown] [N/A:N/A] Wound Margin: [1R:Flat and Intact] [6:Flat and Intact] [N/A:N/A] Granulation Amount: [1R:None Present (0%)] [6:Medium (34-66%)] [N/A:N/A] Granulation Quality: [1R:N/A] [6:Red] [N/A:N/A] Necrotic Amount: [1R:Small (1-33%)] [6:Small (1-33%)] [N/A:N/A] Exposed Structures: [N/A:N/A] Fascia: No Fascia: No Fat:  No Fat: No Tendon: No Tendon: No Muscle: No Muscle: No Joint: No Joint: No Bone: No Bone: No Limited to Skin Limited to Skin Breakdown Breakdown Epithelialization: Large (67-100%) Small (1-33%) N/A Periwound Skin Texture: Callus: Yes Callus: Yes N/A Edema: No Edema: No Excoriation: No Excoriation: No Induration: No Induration: No Crepitus: No Crepitus: No Fluctuance: No Fluctuance: No Friable: No Friable: No Rash: No Rash: No Scarring: No Scarring: No Periwound Skin Dry/Scaly: Yes Moist: Yes N/A Moisture: Maceration: No Dry/Scaly: Yes Moist: No Maceration: No Periwound Skin Color: Atrophie Blanche: No Atrophie Blanche: No N/A Cyanosis: No Cyanosis: No Ecchymosis: No Ecchymosis: No Erythema: No Erythema: No Hemosiderin Staining: No Hemosiderin Staining: No Mottled: No Mottled: No Pallor: No Pallor: No Rubor: No Rubor: No Temperature: No Abnormality No Abnormality N/A Tenderness on No Yes N/A Palpation: Wound Preparation: Ulcer Cleansing: Ulcer Cleansing: N/A Rinsed/Irrigated with Rinsed/Irrigated with Saline Saline Topical Anesthetic Topical Anesthetic Applied: Other: lidocaine Applied: Other: lidocaine 4% 4% Treatment Notes Electronic Signature(s) Signed: 08/21/2014 5:45:27 PM By: Curtis Sites Entered By: Curtis Sites on 08/21/2014 14:40:28 Pryer, [name] ([id number]) -------------------------------------------------------------------------------- Multi-Disciplinary Care Plan Details Patient Name: Kothari, [name] Date of Service: 08/21/2014 1:45 PM Medical Record Number: [id number] Patient Account Number: [id number] Date of Birth/Sex: April 01, 1971 (44 y.o. Female) Treating RN: Curtis Sites Primary Care Physician: PATIENT, NO Other Clinician: Referring Physician: Treating Physician/Extender: Rudene Re in Treatment: 31 Active Inactive HBO Nursing Diagnoses: Anxiety related to feelings of confinement associated with  the hyperbaric oxygen chamber Potential for barotraumas to ears, sinuses, teeth, and lungs or cerebral gas embolism related to changes in atmospheric pressure inside hyperbaric oxygen chamber Potential for oxygen toxicity seizures related to delivery of 100% oxygen at an increased atmospheric pressure Potential for pulmonary oxygen toxicity related to delivery of 100% oxygen at an increased atmospheric pressure Goals: Barotrauma will be prevented during HBO2 Date Initiated: 01/27/2014 Goal Status: Active Patient and/or family will be able to state/discuss factors appropriate to the management of their disease process during treatment Date Initiated: 01/27/2014 Goal Status: Active Patient will tolerate the hyperbaric oxygen therapy treatment Date Initiated: 01/27/2014 Goal Status: Active Patient will tolerate the internal climate of the chamber Date Initiated: 01/27/2014 Goal Status: Active Patient/caregiver will verbalize understanding of HBO goals, rationale, procedures and potential hazards Date Initiated: 01/27/2014 Goal Status: Active Signs and symptoms of pulmonary oxygen toxicity will be recognized and promptly addressed Date Initiated: 01/27/2014 Goal Status: Active Signs and symptoms of seizure will be recognized and promptly addressed ; seizing patients will suffer no harm Date Initiated: 01/27/2014 Goal Status: Active Moronta, [name] ([id number]) Interventions: Administer decongestants, per physician orders, prior to HBO2 Administer the correct therapeutic gas delivery based on the patients needs and limitations, per physician order Assess and provide for patientos comfort related to the hyperbaric environment and equalization of middle ear Assess for signs and symptoms  related to adverse events, including but not limited to confinement anxiety, pneumothorax, oxygen toxicity and baurotrauma Assess patient for any history of confinement anxiety Assess patient's  knowledge and expectations regarding hyperbaric medicine and provide education related to the hyperbaric environment, goals of treatment and prevention of adverse events Implement protocols to decrease risk of pneumothorax in high risk patients Notes: Abuse / Safety / Falls / Self Care Management Nursing Diagnoses: Potential for falls Goals: Patient will remain injury free Date Initiated: 02/17/2014 Goal Status: Active Patient/caregiver will verbalize/demonstrate measures taken to prevent injury and/or falls Date Initiated: 02/17/2014 Goal Status: Active Patient/caregiver will verbalize/demonstrate understanding of what to do in case of emergency Date Initiated: 02/17/2014 Goal Status: Active Interventions: Assess fall risk on admission and as needed Assess impairment of mobility on admission and as needed per policy Provide education on fall prevention Treatment Activities: Patient referred to home care : 08/21/2014 Notes: Necrotic Tissue Nursing Diagnoses: Impaired tissue integrity related to necrotic/devitalized tissue Zeitler, [name] ([id number]) Goals: Necrotic/devitalized tissue will be minimized in the wound bed Date Initiated: 01/13/2014 Goal Status: Active Interventions: Assess patient pain level pre-, during and post procedure and prior to discharge Treatment Activities: Apply topical anesthetic as ordered : 08/21/2014 Notes: Orientation to the Wound Care Program Nursing Diagnoses: Knowledge deficit related to the wound healing center program Goals: Patient/caregiver will verbalize understanding of the Wound Healing Center Program Date Initiated: 01/13/2014 Goal Status: Active Interventions: Provide education on orientation to the wound center Notes: Peripheral Neuropathy Nursing Diagnoses: Knowledge deficit related to disease process and management of peripheral neurovascular dysfunction Goals: Patient/caregiver will verbalize understanding of disease  process and disease management Date Initiated: 01/13/2014 Goal Status: Active Interventions: Assess signs and symptoms of neuropathy upon admission and as needed Notes: Electronic Signature(s) Signed: 08/21/2014 5:45:27 PM By: Curtis Sitesorthy, Joanna Entered By: Curtis Sitesorthy, Joanna on 08/21/2014 14:40:05 Oliveira, [name] ([id number]) Hackert, [name] ([id number]) -------------------------------------------------------------------------------- Pain Assessment Details Patient Name: Faivre, [name] Date of Service: 08/21/2014 1:45 PM Medical Record Number: [id number] Patient Account Number: [id number] Date of Birth/Sex: 01-Aug-1970 (44 y.o. Female) Treating RN: Afful, RN, BSN, [place] [employer] Primary Care Physician: PATIENT, NO Other Clinician: Referring Physician: Treating Physician/Extender: Rudene ReBritto, Errol Weeks in Treatment: 31 Active Problems Location of Pain Severity and Description of Pain Patient Has Paino No Site Locations Pain Management and Medication Current Pain Management: Electronic Signature(s) Signed: 08/21/2014 5:21:09 PM By: Elpidio EricAfful, Rita BSN, RN Entered By: Elpidio EricAfful, Rita on 08/21/2014 14:09:13 Fettes, [name] ([id number]) -------------------------------------------------------------------------------- Patient/Caregiver Education Details Patient Name: Huxford, [name] Date of Service: 08/21/2014 1:45 PM Medical Record Number: [id number] Patient Account Number: [id number] Date of Birth/Gender: 01-Aug-1970 (44 y.o. Female) Treating RN: Curtis Sitesorthy, Joanna Primary Care Physician: PATIENT, NO Other Clinician: Referring Physician: Treating Physician/Extender: Rudene ReBritto, Errol Weeks in Treatment: 31 Education Assessment Education Provided To: Patient and Caregiver Education Topics Provided Wound/Skin Impairment: Handouts: Caring for Your Ulcer Methods: Demonstration, Explain/Verbal Responses: State content correctly Electronic Signature(s) Signed: 08/21/2014 5:45:27 PM By:  Curtis Sitesorthy, Joanna Entered By: Curtis Sitesorthy, Joanna on 08/21/2014 15:08:17 Forand, [name] ([id number]) -------------------------------------------------------------------------------- Wound Assessment Details Patient Name: Montminy, [name] Date of Service: 08/21/2014 1:45 PM Medical Record Number: [id number] Patient Account Number: [id number] Date of Birth/Sex: 01-Aug-1970 (44 y.o. Female) Treating RN: Afful, RN, BSN,  [employer] Primary Care Physician: PATIENT, NO Other Clinician: Referring Physician: Treating Physician/Extender: Rudene ReBritto, Errol Weeks in Treatment: 31 Wound Status Wound Number: 1R Primary Diabetic Wound/Ulcer of the Lower Etiology: Extremity Wound Location: Left Foot - Plantar Wound Open Wounding Event: Gradually Appeared Status:  Date Acquired: 10/28/2013 Comorbid Cataracts, Chronic Obstructive Weeks Of Treatment: 31 History: Pulmonary Disease (COPD), Type II Clustered Wound: No Diabetes Photos Photo Uploaded By: Elpidio Eric on 08/21/2014 17:04:30 Wound Measurements Length: (cm) 0.1 Width: (cm) 0.1 Depth: (cm) 0.1 Area: (cm) 0.008 Volume: (cm) 0.001 % Reduction in Area: 99.9% % Reduction in Volume: 99.9% Epithelialization: Large (67-100%) Tunneling: No Undermining: No Wound Description Classification: Grade 2 Wound Margin: Flat and Intact Exudate Amount: None Present Foul Odor After Cleansing: No Wound Bed Granulation Amount: None Present (0%) Exposed Structure Necrotic Amount: Small (1-33%) Fascia Exposed: No Necrotic Quality: Adherent Slough Fat Layer Exposed: No Tendon Exposed: No Muscle Exposed: No Joint Exposed: No Fosnaugh, Tonnette L. ([id number]) Bone Exposed: No Limited to Skin Breakdown Periwound Skin Texture Texture Color No Abnormalities Noted: No No Abnormalities Noted: No Callus: Yes Atrophie Blanche: No Crepitus: No Cyanosis: No Excoriation: No Ecchymosis: No Fluctuance: No Erythema: No Friable: No Hemosiderin Staining:  No Induration: No Mottled: No Localized Edema: No Pallor: No Rash: No Rubor: No Scarring: No Temperature / Pain Moisture Temperature: No Abnormality No Abnormalities Noted: No Dry / Scaly: Yes Maceration: No Moist: No Wound Preparation Ulcer Cleansing: Rinsed/Irrigated with Saline Topical Anesthetic Applied: Other: lidocaine 4%, Treatment Notes Wound #1R (Left, Plantar Foot) 1. Cleansed with: Clean wound with Normal Saline 2. Anesthetic Topical Lidocaine 4% cream to wound bed prior to debridement 4. Dressing Applied: Aquacel Ag Other dressing (specify in notes) 5. Secondary Dressing Applied ABD and Kerlix/Conform 7. Secured with Tape Notes felt Electronic Signature(s) Signed: 08/21/2014 5:21:09 PM By: Elpidio Eric BSN, RN Entered By: Elpidio Eric on 08/21/2014 14:14:42 Spielberg, [name] ([id number]) -------------------------------------------------------------------------------- Wound Assessment Details Patient Name: Floor, [name] Date of Service: 08/21/2014 1:45 PM Medical Record Number: [id number] Patient Account Number: [id number] Date of Birth/Sex: 1971-02-19 (44 y.o. Female) Treating RN: Afful, RN, BSN, Psychologist, clinical Primary Care Physician: PATIENT, NO Other Clinician: Referring Physician: Treating Physician/Extender: Rudene Re in Treatment: 31 Wound Status Wound Number: 6 Primary Diabetic Wound/Ulcer of the Lower Etiology: Extremity Wound Location: Right Metatarsal head first - Plantar Wound Open Status: Wounding Event: Gradually Appeared Comorbid Cataracts, Chronic Obstructive Date Acquired: 02/09/2014 History: Pulmonary Disease (COPD), Type II Weeks Of Treatment: 24 Diabetes Clustered Wound: No Photos Photo Uploaded By: Elpidio Eric on 08/21/2014 17:04:30 Wound Measurements Length: (cm) 0.3 Width: (cm) 1 Depth: (cm) 0.3 Area: (cm) 0.236 Volume: (cm) 0.071 % Reduction in Area: 90.7% % Reduction in Volume: 71.9% Epithelialization:  Small (1-33%) Tunneling: No Undermining: No Wound Description Classification: Grade 3 Wagner Verification: MRI Wound Margin: Flat and Intact Exudate Amount: Small Exudate Type: Serosanguineous Exudate Color: red, brown Foul Odor After Cleansing: No Wound Bed Granulation Amount: Medium (34-66%) Exposed Structure Granulation Quality: Red Fascia Exposed: No Stinnette, Lanita L. ([id number]) Necrotic Amount: Small (1-33%) Fat Layer Exposed: No Necrotic Quality: Adherent Slough Tendon Exposed: No Muscle Exposed: No Joint Exposed: No Bone Exposed: No Limited to Skin Breakdown Periwound Skin Texture Texture Color No Abnormalities Noted: No No Abnormalities Noted: No Callus: Yes Atrophie Blanche: No Crepitus: No Cyanosis: No Excoriation: No Ecchymosis: No Fluctuance: No Erythema: No Friable: No Hemosiderin Staining: No Induration: No Mottled: No Localized Edema: No Pallor: No Rash: No Rubor: No Scarring: No Temperature / Pain Moisture Temperature: No Abnormality No Abnormalities Noted: No Tenderness on Palpation: Yes Dry / Scaly: Yes Maceration: No Moist: Yes Wound Preparation Ulcer Cleansing: Rinsed/Irrigated with Saline Topical Anesthetic Applied: Other: lidocaine 4%, Treatment Notes Wound #6 (Right, Plantar Metatarsal head first) 1. Cleansed with:  Clean wound with Normal Saline 2. Anesthetic Topical Lidocaine 4% cream to wound bed prior to debridement 4. Dressing Applied: Aquacel Ag Other dressing (specify in notes) 5. Secondary Dressing Applied ABD and Kerlix/Conform 7. Secured with Tape Notes felt Electronic Signature(s) Vandenbrink, [name] ([id number]) Signed: 08/21/2014 5:21:09 PM By: Elpidio EricAfful, Rita BSN, RN Entered By: Elpidio EricAfful, Rita on 08/21/2014 14:16:54 Lupu, [name] ([id number]) -------------------------------------------------------------------------------- Vitals Details Patient Name: Mearns, [name] Date of Service:  08/21/2014 1:45 PM Medical Record Number: [id number] Patient Account Number: [id number] Date of Birth/Sex: [birth year] (44 y.o. Female) Treating RN: Afful, RN, BSN, Psychologist, clinicalita Primary Care Physician: PATIENT, NO Other Clinician: Referring Physician: Treating Physician/Extender: Rudene ReBritto, Errol Weeks in Treatment: 31 Vital Signs Time Taken: 14:17 Temperature (F): 98.6 Height (in): 76 Pulse (bpm): 100 Weight (lbs): 132 Respiratory Rate (breaths/min): 18 Body Mass Index (BMI): 16.1 Blood Pressure (mmHg): 128/90 Reference Range: 80 - 120 mg / dl Electronic Signature(s) Signed: 08/21/2014 5:21:09 PM By: Elpidio EricAfful, Rita BSN, RN Entered By: Elpidio EricAfful, Rita on 08/21/2014 14:18:08

## 2014-08-28 ENCOUNTER — Encounter: Payer: Medicare PPO | Admitting: Surgery

## 2014-08-28 DIAGNOSIS — L97512 Non-pressure chronic ulcer of other part of right foot with fat layer exposed: Secondary | ICD-10-CM | POA: Diagnosis not present

## 2014-08-29 NOTE — Progress Notes (Signed)
Larocque, DAMIEN CISAR ([id number]) Visit Report for 08/28/2014 Chief Complaint Document Details Patient Name: [name] [name], [name] [name]. Date of Service: 08/28/2014 8:15 AM Medical Record Number: [id number] Patient Account Number: [id number] Date of Birth/Sex: Sep 27, 1970 (44 y.o. Female) Treating RN: Primary Care Physician: PATIENT, NO Other Clinician: Referring Physician: Treating Physician/Extender: [name] [name] in Treatment: 32 Information Obtained from: Patient Chief Complaint She has wounds both feet plantar aspect over the 1st metatarsals. She and her husband say she does not walk much and she does not lay her foot against a hard surface. Most of the time she is on the bed. She has seen podiatry and has diabetic shoes. Her sugar is better controlled at present. no pain and no discharge from the wounds. to last week's discussion she says she has been in bed almost the entire weekend and has not been on her feet as much. She is also wearing her special Darco shoes. She is also been very compliant with her diabetic control. Electronic Signature(s) Signed: 08/28/2014 12:26:09 PM By: Evlyn Kanner MD, FACS Entered By: Evlyn Kanner on 08/28/2014 08:57:58 Wetherington, [name] [name] ([id number]) -------------------------------------------------------------------------------- HPI Details Patient Name: Schnackenberg, [name] [name]. Date of Service: 08/28/2014 8:15 AM Medical Record Number: [id number] Patient Account Number: [id number] Date of Birth/Sex: January 17, 1971 (44 y.o. Female) Treating RN: Primary Care Physician: PATIENT, NO Other Clinician: Referring Physician: Treating Physician/Extender: [name] [name] in Treatment: 32 History of Present Illness HPI Description: [name] [name] is a 35F w/ h/o DM who presents with wounds on B/L feet. She states that she has the wounds for months (she first presented in 01/13/14). She has had them debrided and had been dressing them with silvadene at the time  or presentation. Her sugars are usually in the low 200s. She has an upcoming appointment to draw a HgBA1C. She has a h/o L femoral artery injury during a hernia repair (2003). The wound closure at that time required a muscle flap and post-op HBO treatment. We got an MRI of her right footwhich was worrisome for soft tissue infection as well as adjacent osteomyelitis. This is in regards to soft tissue ulceration medial to the first metatarsal head. A a subsequent bone scan of the right foot also confirm these results. The patient is here for followup today.she denies any fevers at home. she is compliant with her dressing changes. She recently had myryngotomy tubes placed in her R ear. later she also had a myringotomy tube placed in the left ear. On careful questioning she says she does not walk around the house much at all. In the day she may be awake for about an hour and during the night she may be awake for about 2 or 3 hours and she does walk around at that time. 06/12/14 -- since I had a detailed discussion with them last week and now ground rules about how she should be walking with a Darco shoes and how we are she should be on her feet she and her husband say that they have been very compliant. sugars were often high for a while but I believe they change the machine and it is much better now. overall she is doing much better than last week. 06/19/14 - she has been doing very well staying off her feet and has no fresh issues. 07/10/2014 the patient and her daughter say that she is being very compliant with not having her feet on the ground without her offloading shoes and she is not walking around too much. Her blood sugars also  been running well. 06/19/14 -- her husband estimates that she is on her feet only about an hour a day and other than that she is always in either a recumbent position of her feet off the ground. Doing fine otherwise and has no fresh issues. 06/26/14 -- she continues to  stay off her feet and has for her blood sugar goes she says she has not had any recent hemoglobin A1c done. 07/04/2014 -- a few days ago her sugar went up very high and she had to be seen in the emergency room and it had to be taken care of. This may have been due to an inadvertent skipping of her insulin dose. other than that she is now back to her baseline. 07/24/2014 -- the patient has not come to the wound center for several days now and she has missed [name] [name], [name]. ([id number]) several of her HBO treatments. He had asked her to see her PCP but she has not done so yet. She says she's been febrile and has had shivering and body ache. Does say she's been off her feet. 07/31/2014 -- he has gone to her PCP and they have cut her pain medications in half and they have also not she is hypothyroid and given her some medications and blood tests for this. she says she's feeling much better now. She says she's been compliant with her dark offloading shoes and she would like to take her last 4 treatments next week for hyperbaric oxygen therapy. 08/14/2014 -- she had a injury to her right foot when she tripped over the edge of the bed and says she had her offloading shoes on but I doubt because the site of the injury is not in keeping with her having her offloading shoe on. the patient generally has not been very compliant with all instructions. 08/21/2014 -- her husband is with her today and in the course of conversation it has been clearly demonstrated that the patient does not use her offloading shoes at all while she is at home. She walks with her bandages and has no footwear whatsoever. This is the first time the truth has come out because all along their been telling me that they are using the Darco offloading shoes form morning till night. he has also said that her sugars are running between 190-200 and I have again told them that this needs to be much better controlled as they have not  been following up with their PCP regularly. 08/28/2014 -- the patient says she's been very compliant and her blood sugars are now in the low 100s. She also says that 90% of the time she is wearing a Darco shoe and has not been walking with her bare feet. Electronic Signature(s) Signed: 08/28/2014 12:26:09 PM By: Evlyn Kanner MD, FACS Entered By: Evlyn Kanner on 08/28/2014 08:59:50 [name] [name], [name] [name] ([id number]) -------------------------------------------------------------------------------- Physical Exam Details Patient Name: Bjorkman, [name] [name]. Date of Service: 08/28/2014 8:15 AM Medical Record Number: [id number] Patient Account Number: [id number] Date of Birth/Sex: 1970/06/16 (44 y.o. Female) Treating RN: Primary Care Physician: PATIENT, NO Other Clinician: Referring Physician: Treating Physician/Extender: [name] [name] in Treatment: 32 Constitutional . Pulse regular. Respirations normal and unlabored. Afebrile. . Eyes Nonicteric. Reactive to light. Ears, Nose, Mouth, and Throat Lips, teeth, and gums WNL.Marland Kitchen Moist mucosa without lesions . Neck supple and nontender. No palpable supraclavicular or cervical adenopathy. Normal sized without goiter. Respiratory WNL. No retractions.. Cardiovascular Pedal Pulses WNL. No clubbing, cyanosis or edema. Integumentary (Hair, Skin) both  plantar aspects of the feet look much better than last week and there is minimal callus and the open ulceration has been covered with callus and fibrin. I'm going to let this mature and not sharply debrided this week. . No crepitus or fluctuance. No peri-wound warmth or erythema. No masses.Marland Kitchen Psychiatric Judgement and insight Intact.. No evidence of depression, anxiety, or agitation.. Electronic Signature(s) Signed: 08/28/2014 12:26:09 PM By: Evlyn Kanner MD, FACS Entered By: Evlyn Kanner on 08/28/2014 09:01:09 Magar, [name] [name]  ([id number]) -------------------------------------------------------------------------------- Physician Orders Details Patient Name: Epping, [name] [name]. Date of Service: 08/28/2014 8:15 AM Medical Record Number: [id number] Patient Account Number: [id number] Date of Birth/Sex: 01/17/1971 (44 y.o. Female) Treating RN: Curtis Sites Primary Care Physician: PATIENT, NO Other Clinician: Referring Physician: Treating Physician/Extender: [name] [name] in Treatment: 64 Verbal / Phone Orders: Yes Clinician: Curtis Sites Read Back and Verified: Yes Diagnosis Coding Wound Cleansing Wound #1R Left,Plantar Foot o Clean wound with Normal Saline. Wound #6 Right,Plantar Metatarsal head first o Clean wound with Normal Saline. Anesthetic Wound #1R Left,Plantar Foot o Topical Lidocaine 4% cream applied to wound bed prior to debridement Wound #6 Right,Plantar Metatarsal head first o Topical Lidocaine 4% cream applied to wound bed prior to debridement Primary Wound Dressing Wound #1R Left,Plantar Foot o Foam o Other: - felt Wound #6 Right,Plantar Metatarsal head first o Foam o Other: - felt Secondary Dressing Wound #1R Left,Plantar Foot o Conform/Kerlix Wound #6 Right,Plantar Metatarsal head first o Conform/Kerlix Dressing Change Frequency Wound #1R Left,Plantar Foot o Change dressing every other day. Wound #6 Right,Plantar Metatarsal head first o Change dressing every other day. [name] [name], [name] [name] ([id number]) Follow-up Appointments Wound #1R Left,Plantar Foot o Return Appointment in 1 week. Wound #6 Right,Plantar Metatarsal head first o Return Appointment in 1 week. Off-Loading Wound #1R Left,Plantar Foot o Other: - darco Front Loading with peg assist Wound #6 Right,Plantar Metatarsal head first o Other: - darco Front Loading with peg assist Additional Orders / Instructions Wound #1R Left,Plantar Foot o Other: - Keep pressure off of  feet Wound #6 Right,Plantar Metatarsal head first o Other: - Keep pressure off of feet Electronic Signature(s) Signed: 08/28/2014 12:26:09 PM By: Evlyn Kanner MD, FACS Signed: 08/28/2014 5:34:11 PM By: Curtis Sites Entered By: Curtis Sites on 08/28/2014 08:51:09 Biernat, [name] [name] ([id number]) -------------------------------------------------------------------------------- Problem List Details Patient Name: Tamblyn, Baileigh L. Date of Service: 08/28/2014 8:15 AM Medical Record Number: [id number] Patient Account Number: [id number] Date of Birth/Sex: 22-Jul-1970 (44 y.o. 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 Date of Service: 08/28/2014 8:15 AM Medical Record Number: [id number] Patient Account Number: [id number] Date of Birth/Sex: 1971-02-22 (44 y.o.  Female) Treating RN: Primary Care Physician: PATIENT,  NO Other Clinician: Referring Physician: Treating Physician/Extender: [name] ReBritto, [name] Gravlin Weeks in Treatment: 32 Subjective Chief Complaint Information obtained from Patient She has wounds both feet plantar aspect over the 1st metatarsals. She and her husband say she does not walk much and she does not lay her foot against a hard surface. Most of the time she is on the bed. She has seen podiatry and has diabetic shoes. Her sugar is better controlled at present. no pain and no discharge from the wounds. to last week's discussion she says she has been in bed almost the entire weekend and has not been on her feet as much. She is also wearing her special Darco shoes. She is also been very compliant with her diabetic control. History of Present Illness (HPI) Maureen RalphsVivian is a [age]F w/ h/o DM who presents with wounds on B/L feet. She states that she has the wounds for months (she first presented in 01/13/14). She has had them debrided and had been dressing them with silvadene at the time or presentation. Her sugars are usually in the low 200s. She has an upcoming appointment to draw a HgBA1C. She has a h/o L femoral artery injury during a hernia repair (2003). The wound closure at that time required a muscle flap and post-op HBO treatment. We got an MRI of her right footwhich was worrisome for soft tissue infection as well as adjacent osteomyelitis. This is in regards to soft tissue ulceration medial to the first metatarsal head. A a subsequent bone scan of the right foot also confirm these results. The patient is here for followup today.she denies any fevers at home. she is compliant with her dressing changes. She recently had myryngotomy tubes placed in her R ear. later she also had a myringotomy tube placed in the left ear. On careful questioning she says she does not walk around the house much at all. In the day she may be awake for about an hour and  during the night she may be awake for about 2 or 3 hours and she does walk around at that time. 06/12/14 -- since I had a detailed discussion with them last week and now ground rules about how she should be walking with a Darco shoes and how we are she should be on her feet she and her husband say that they have been very compliant. sugars were often high for a while but I believe they change the machine and it is much better now. overall she is doing much better than last week. 06/19/14 - she has been doing very well staying off her feet and has no fresh issues. 07/10/2014 the patient and her daughter say that she is being very compliant with not having her feet on the ground without her offloading shoes and she is not walking around too much. Her blood sugars also been running well. Filippone, [name] ([id number]) 06/19/14 -- her husband estimates that she is on her feet only about an hour a day and other than that she is always in either a recumbent position of her feet off the ground. Doing fine otherwise and has no fresh issues. 06/26/14 -- she continues to stay off her feet and has for her blood sugar goes she says she has not had any recent hemoglobin A1c done. 07/04/2014 -- a few days ago her sugar went up very high and she had to be seen in the emergency room and it had to be taken care of. This may have been due to an  inadvertent skipping of her insulin dose. other than that she is now back to her baseline. 07/24/2014 -- the patient has not come to the wound center for several days now and she has missed several of her HBO treatments. He had asked her to see her PCP but she has not done so yet. She says she's been febrile and has had shivering and body ache. Does say she's been off her feet. 07/31/2014 -- he has gone to her PCP and they have cut her pain medications in half and they have also not she is hypothyroid and given her some medications and blood tests for this. she says she's  feeling much better now. She says she's been compliant with her dark offloading shoes and she would like to take her last 4 treatments next week for hyperbaric oxygen therapy. 08/14/2014 -- she had a injury to her right foot when she tripped over the edge of the bed and says she had her offloading shoes on but I doubt because the site of the injury is not in keeping with her having her offloading shoe on. the patient generally has not been very compliant with all instructions. 08/21/2014 -- her husband is with her today and in the course of conversation it has been clearly demonstrated that the patient does not use her offloading shoes at all while she is at home. She walks with her bandages and has no footwear whatsoever. This is the first time the truth has come out because all along their been telling me that they are using the Darco offloading shoes form morning till night. he has also said that her sugars are running between 190-200 and I have again told them that this needs to be much better controlled as they have not been following up with their PCP regularly. 08/28/2014 -- the patient says she's been very compliant and her blood sugars are now in the low 100s. She also says that 90% of the time she is wearing a Darco shoe and has not been walking with her bare feet. Objective Constitutional Pulse regular. Respirations normal and unlabored. Afebrile. Vitals Time Taken: 8:20 AM, Height: 76 in, Weight: 132 lbs, BMI: 16.1, Temperature: 98 F, Pulse: 84 bpm, Respiratory Rate: 16 breaths/min, Blood Pressure: 125/87 mmHg. General Notes: pt states blood sugar has been 100-110 Eyes Nonicteric. Reactive to light. Mendenhall, [name] [name] ([id number]) Ears, Nose, Mouth, and Throat Lips, teeth, and gums WNL.Marland Kitchen Moist mucosa without lesions . Neck supple and nontender. No palpable supraclavicular or cervical adenopathy. Normal sized without goiter. Respiratory WNL. No  retractions.. Cardiovascular Pedal Pulses WNL. No clubbing, cyanosis or edema. Psychiatric Judgement and insight Intact.. No evidence of depression, anxiety, or agitation.. Integumentary (Hair, Skin) both plantar aspects of the feet look much better than last week and there is minimal callus and the open ulceration has been covered with callus and fibrin. I'm going to let this mature and not sharply debrided this week. . No crepitus or fluctuance. No peri-wound warmth or erythema. No masses.. Wound #1R status is Open. Original cause of wound was Gradually Appeared. The wound is located on the Left,Plantar Foot. The wound measures 0.1cm length x 0.3cm width x 0.1cm depth; 0.024cm^2 area and 0.002cm^3 volume. The wound is limited to skin breakdown. There is no tunneling or undermining noted. There is a none present amount of drainage noted. The wound margin is flat and intact. There is no granulation within the wound bed. There is a small (1-33%) amount of necrotic tissue  within the wound bed including Adherent Slough. The periwound skin appearance exhibited: Callus, Dry/Scaly. The periwound skin appearance did not exhibit: Crepitus, Excoriation, Fluctuance, Friable, Induration, Localized Edema, Rash, Scarring, Maceration, Moist, Atrophie Blanche, Cyanosis, Ecchymosis, Hemosiderin Staining, Mottled, Pallor, Rubor, Erythema. Periwound temperature was noted as No Abnormality. Wound #6 status is Open. Original cause of wound was Gradually Appeared. The wound is located on the Right,Plantar Metatarsal head first. The wound measures 0.2cm length x 1cm width x 0.1cm depth; 0.157cm^2 area and 0.016cm^3 volume. The wound is limited to skin breakdown. There is no tunneling or undermining noted. There is a none present amount of drainage noted. The wound margin is flat and intact. There is small (1-33%) red granulation within the wound bed. There is a small (1-33%) amount of necrotic tissue within the  wound bed including Adherent Slough. The periwound skin appearance exhibited: Callus, Dry/Scaly. The periwound skin appearance did not exhibit: Crepitus, Excoriation, Fluctuance, Friable, Induration, Localized Edema, Rash, Scarring, Maceration, Moist, Atrophie Blanche, Cyanosis, Ecchymosis, Hemosiderin Staining, Mottled, Pallor, Rubor, Erythema. Periwound temperature was noted as No Abnormality. both plantar aspects of the feet look much better than last week and there is minimal callus and the open ulceration has been covered with callus and fibrin. I'm going to let this mature and not sharply debrided this week. 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 Wound #6 Right,Plantar Metatarsal head first: Clean wound with Normal Saline. Anesthetic: Wound #1R Left,Plantar Foot: Topical Lidocaine 4% cream applied to wound bed prior to debridement Wound #6 Right,Plantar Metatarsal head first: Topical Lidocaine 4% cream applied to wound bed prior to debridement Primary Wound Dressing: Wound #1R Left,Plantar Foot: Foam Other: - felt Wound #6 Right,Plantar Metatarsal head first: Foam Other: - felt Secondary Dressing: Wound #1R Left,Plantar Foot: Conform/Kerlix Wound #6 Right,Plantar Metatarsal head first: Conform/Kerlix Dressing Change Frequency: Wound #1R Left,Plantar Foot: Change dressing every other day. Wound #6 Right,Plantar Metatarsal head  first: Change dressing every other day. Follow-up Appointments: Wound #1R Left,Plantar Foot: Return Appointment in 1 week. Belote, [name] [name] ([id number]) Wound #6 Right,Plantar Metatarsal head first: Return Appointment in 1 week. Off-Loading: Wound #1R Left,Plantar Foot: Other: - darco Front Loading with peg assist Wound #6 Right,Plantar Metatarsal head first: Other: - darco Front Loading with peg assist Additional Orders / Instructions: Wound #1R Left,Plantar Foot: Other: - Keep pressure off of feet Wound #6 Right,Plantar Metatarsal head first: Other: - Keep pressure off of feet we will apply a piece of foam over the callused area and then put a ring off felt to offload this and she will continue to use her darko shoe. She will come back and see me next week. I have again emphasized the need for offloading and good control of her blood sugar. Electronic Signature(s) Signed: 08/28/2014 12:26:09 PM By: Evlyn Kanner MD, FACS Entered By: Evlyn Kanner on 08/28/2014 09:01:58 Olguin, [name] [name] ([id number]) -------------------------------------------------------------------------------- SuperBill Details Patient Name: Cefalu, [name] [name]. Date of Service: 08/28/2014 Medical Record Number: [id number] Patient Account Number: [id number] Date of Birth/Sex: [birth year] (43  y.o. 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; uncontrolled ICD-10 Codes Code Description E11.621 Type 2 diabetes mellitus with foot ulcer L97.512 Non-pressure chronic ulcer of other part of right foot with fat layer exposed L97.522 Non-pressure chronic ulcer of other part of left foot with fat layer  exposed M86.671 Other chronic osteomyelitis, right ankle and foot Facility Procedures CPT4 Code: [id number] Description: 99213 - WOUND CARE VISIT-LEV 3 EST PT Modifier: Quantity: 1 Physician Procedures CPT4 Code Description: [id number] 99213 - WC PHYS LEVEL 3 - EST PT ICD-10 Description Diagnosis E11.621 Type 2 diabetes mellitus with foot ulcer L97.512 Non-pressure chronic ulcer of other part of right fo L97.522 Non-pressure chronic ulcer of other part of  left foo Modifier: ot with fat lay t with fat laye Quantity: 1 er exposed r exposed Electronic Signature(s) Signed: 08/28/2014 12:26:09 PM By: Evlyn KannerBritto, Leeam Cedrone MD, FACS Entered By: Evlyn KannerBritto, Liberato Stansbery on 08/28/2014 09:02:16

## 2014-08-29 NOTE — Progress Notes (Signed)
[name] [name] [name] ([id number]) Visit Report for 08/28/2014 Arrival Information Details Patient Name: [name] [name] [name] [name]. Date of Service: 08/28/2014 8:15 AM Medical Record Number: [id number] Patient Account Number: [id number] Date of Birth/Sex: 1970-10-05 (44 y.o. Female) Treating RN: Kristin Bruins Primary Care Physician: PATIENT, NO Other Clinician: Referring Physician: Treating Physician/Extender: Rudene Re in Treatment: 32 Visit Information History Since Last Visit All ordered tests and consults were completed: Yes Patient Arrived: Ambulatory Added or deleted any medications: No Arrival Time: 08:20 Any new allergies or adverse reactions: No Accompanied By: husband Had a fall or experienced change in No Transfer Assistance: None activities of daily living that may affect Patient Identification Verified: Yes risk of falls: Secondary Verification Process Yes Signs or symptoms of abuse/neglect since last No Completed: visito Patient Requires Transmission-Based No Hospitalized since last visit: No Precautions: Pain Present Now: No Patient Has Alerts: Yes Electronic Signature(s) Signed: 08/28/2014 4:22:36 PM By: Kristin Bruins Entered By: Kristin Bruins on 08/28/2014 08:21:00 [name] [name] ([id number]) -------------------------------------------------------------------------------- Clinic Level of Care Assessment Details Patient Name: [name] [name]. Date of Service: 08/28/2014 8:15 AM Medical Record Number: [id number] Patient Account Number: [id number] Date of Birth/Sex: [birth year] (44 y.o. Female) Treating RN: Curtis Sites Primary Care Physician: PATIENT, NO Other Clinician: Referring Physician: Treating Physician/Extender: Rudene Re in Treatment: 32 Clinic Level of Care Assessment Items TOOL 4 Quantity Score []  - Use when only an EandM is performed on FOLLOW-UP visit 0 ASSESSMENTS - Nursing Assessment / Reassessment X -  Reassessment of Co-morbidities (includes updates in patient status) 1 10 X - Reassessment of Adherence to Treatment Plan 1 5 ASSESSMENTS - Wound and Skin Assessment / Reassessment X - Simple Wound Assessment / Reassessment - one wound 1 5 []  - Complex Wound Assessment / Reassessment - multiple wounds 0 []  - Dermatologic / Skin Assessment (not related to wound area) 0 ASSESSMENTS - Focused Assessment []  - Circumferential Edema Measurements - multi extremities 0 []  - Nutritional Assessment / Counseling / Intervention 0 X - Lower Extremity Assessment (monofilament, tuning fork, pulses) 1 5 []  - Peripheral Arterial Disease Assessment (using hand held doppler) 0 ASSESSMENTS - Ostomy and/or Continence Assessment and Care []  - Incontinence Assessment and Management 0 []  - Ostomy Care Assessment and Management (repouching, etc.) 0 PROCESS - Coordination of Care X - Simple Patient / Family Education for ongoing care 1 15 []  - Complex (extensive) Patient / Family Education for ongoing care 0 []  - Staff obtains Chiropractor, Records, Test Results / Process Orders 0 []  - Staff telephones HHA, Nursing Homes / Clarify orders / etc 0 []  - Routine Transfer to another Facility (non-emergent condition) 0 [name] [name] ([id number]) []  - Routine Hospital Admission (non-emergent condition) 0 []  - New Admissions / Manufacturing engineer / Ordering NPWT, Apligraf, etc. 0 []  - Emergency Hospital Admission (emergent condition) 0 X - Simple Discharge Coordination 1 10 []  - Complex (extensive) Discharge Coordination 0 PROCESS - Special Needs []  - Pediatric / Minor Patient Management 0 []  - Isolation Patient Management 0 []  - Hearing / Language / Visual special needs 0 []  - Assessment of Community assistance (transportation, D/C planning, etc.) 0 []  - Additional assistance / Altered mentation 0 []  - Support Surface(s) Assessment (bed, cushion, seat, etc.) 0 INTERVENTIONS - Wound Cleansing /  Measurement []  - Simple Wound Cleansing - one wound 0 X - Complex Wound Cleansing - multiple wounds 2 5 X - Wound Imaging (photographs - any number of wounds) 1 5 []  - Wound Tracing (instead  of photographs) 0  - Simple Wound Measurement - one wound 0 X - Complex Wound Measurement - multiple wounds 2 5 INTERVENTIONS - Wound Dressings X - Small Wound Dressing one or multiple wounds 2 10  - Medium Wound Dressing one or multiple wounds 0  - Large Wound Dressing one or multiple wounds 0  - Application of Medications - topical 0  - Application of Medications - injection 0 INTERVENTIONS - Miscellaneous  - External ear exam 0 Groninger, Rajanee L. ([id number])  - Specimen Collection (cultures, biopsies, blood, body fluids, etc.) 0  - Specimen(s) / Culture(s) sent or taken to Lab for analysis 0  - Patient Transfer (multiple staff / Michiel Sites Lift / Similar devices) 0  - Simple Staple / Suture removal (25 or less) 0  - Complex Staple / Suture removal (26 or more) 0  - Hypo / Hyperglycemic Management (close monitor of Blood Glucose) 0  - Ankle / Brachial Index (ABI) - do not check if billed separately 0 X - Vital Signs 1 5 Has the patient been seen at the hospital within the last three years: Yes Total Score: 100 Level Of Care: New/Established - Level 3 Electronic Signature(s) Signed: 08/28/2014 5:34:11 PM By: Curtis Sites Entered By: Curtis Sites on 08/28/2014 08:52:00 [name] [name] ([id number]) -------------------------------------------------------------------------------- Encounter Discharge Information Details Patient Name: [name] [name]. Date of Service: 08/28/2014 8:15 AM Medical Record Number: [id number] Patient Account Number: [id number] Date of Birth/Sex: 1970-06-24 (44 y.o. Female) Treating RN: Kristin Bruins Primary Care Physician: PATIENT, NO Other Clinician: Referring Physician: Treating Physician/Extender: Rudene Re in  Treatment: 73 Encounter Discharge Information Items Discharge Pain Level: 0 Discharge Condition: Stable Ambulatory Status: Ambulatory Discharge Destination: Home Transportation: Private Auto Accompanied By: daughter Schedule Follow-up Appointment: Yes Medication Reconciliation completed and provided to Patient/Care No Maureen Duesing: Provided on Clinical Summary of Care: 08/28/2014 Form Type Recipient Paper Patient [facility] Electronic Signature(s) Signed: 08/28/2014 9:02:44 AM By: Gwenlyn Perking Entered By: Gwenlyn Perking on 08/28/2014 09:02:43 [name] [name] ([id number]) -------------------------------------------------------------------------------- Lower Extremity Assessment Details Patient Name: Gang, [name] [name]. Date of Service: 08/28/2014 8:15 AM Medical Record Number: [id number] Patient Account Number: [id number] Date of Birth/Sex: 21-Dec-1970 (44 y.o. Female) Treating RN: Kristin Bruins Primary Care Physician: PATIENT, NO Other Clinician: Referring Physician: Treating Physician/Extender: Rudene Re in Treatment: 32 Edema Assessment Assessed: [Left: No] [Right: No] Edema: [Left: No] [Right: No] Vascular Assessment Claudication: Claudication Assessment [Left:None] [Right:None] Pulses: Posterior Tibial Palpable: [Left:No] [Right:No] Doppler: [Left:Multiphasic] [Right:Multiphasic] Dorsalis Pedis Palpable: [Left:No] [Right:No] Doppler: [Left:Multiphasic] [Right:Multiphasic] Extremity colors, hair growth, and conditions: Extremity Color: [Left:Hyperpigmented] [Right:Hyperpigmented] Hair Growth on Extremity: [Left:Yes] [Right:Yes] Temperature of Extremity: [Left:Warm] [Right:Warm] Capillary Refill: [Left:> 3 seconds] [Right:> 3 seconds] Toe Nail Assessment Left: Right: Thick: Yes Yes Discolored: Yes Yes Deformed: Yes Yes Improper Length and Hygiene: Yes Yes Electronic Signature(s) Signed: 08/28/2014 4:22:36 PM By: Kristin Bruins Entered By: Kristin Bruins on 08/28/2014 08:31:17 Deroo, [name] [name] ([id number]) -------------------------------------------------------------------------------- Multi Wound Chart Details Patient Name: Yeakle, [name] [name]. Date of Service: 08/28/2014 8:15 AM Medical Record Number: [id number] Patient Account Number: [id number] Date of Birth/Sex: [birth year] (44 y.o. 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] [6:the Lower Extremity] Comorbid History: [1R:Cataracts, Chronic Obstructive Pulmonary Disease (COPD), Type II Disease (COPD), Type II Diabetes] [6:Cataracts, Chronic Obstructive Pulmonary Diabetes] [N/A:N/A] Date Acquired: [1R:10/28/2013] [6:02/09/2014] [N/A:N/A] Weeks of Treatment: [1R:32] [6:25] [N/A:N/A] Wound Status: [1R:Open] [6:Open] [N/A:N/A] Wound Recurrence: [1R:Yes] [6:No] [N/A:N/A] Measurements L x W x D 0.1x0.3x0.1 [6:0.2x1x0.1] [N/A:N/A] (cm) Area (cm) : [1R:0.024] [6:0.157] [N/A:N/A] Volume (cm) : [1R:0.002] [6:0.016] [N/A:N/A] % Reduction in Area: [1R:99.70%] [6:93.80%] [N/A:N/A] % Reduction in Volume: 99.80% [6:93.70%] [N/A:N/A] Classification: [1R:Grade 2] [6:Grade 3] [N/A:N/A] Wagner Verification: [1R:N/A] [6:MRI] [N/A:N/A] Exudate Amount: [1R:None Present] [6:None Present] [N/A:N/A] Wound Margin: [1R:Flat and Intact] [6:Flat and Intact] [N/A:N/A] Granulation Amount:  [1R:None Present (0%)] [6:Small (1-33%)] [N/A:N/A] Granulation Quality: [1R:N/A] [6:Red] [N/A:N/A] Necrotic Amount: [1R:Small (1-33%)] [6:Small (1-33%)] [N/A:N/A] Exposed Structures: [1R:Fascia: No Fat: No Tendon: No] [6:Fascia: No Fat: No Tendon: No] [N/A:N/A] Muscle: No Muscle: No Joint: No Joint: No Bone: No Bone: No Limited to Skin Limited to Skin Breakdown Breakdown Epithelialization: Large (67-100%) Large (67-100%) N/A Periwound Skin Texture: Callus: Yes Callus: Yes N/A Edema: No Edema: No Excoriation: No Excoriation: No Induration: No Induration: No Crepitus: No Crepitus: No Fluctuance: No Fluctuance: No Friable: No Friable: No Rash: No Rash: No Scarring: No Scarring: No Periwound Skin Dry/Scaly: Yes Dry/Scaly: Yes N/A Moisture: Maceration: No Maceration: No Moist: No Moist: No Periwound Skin Color: Atrophie Blanche: No Atrophie Blanche: No N/A Cyanosis: No Cyanosis: No Ecchymosis: No Ecchymosis: No Erythema: No Erythema: No Hemosiderin Staining: No Hemosiderin Staining: No Mottled: No Mottled: No Pallor: No Pallor: No Rubor: No Rubor: No Temperature: No Abnormality No Abnormality N/A Tenderness on No No N/A Palpation: Wound Preparation: Ulcer Cleansing: Ulcer Cleansing: N/A Rinsed/Irrigated with Rinsed/Irrigated with Saline Saline Topical Anesthetic Topical Anesthetic Applied: Other: lidocaine Applied: Other: lidocaine 4% 4% Treatment Notes Electronic Signature(s) Signed: 08/28/2014 5:34:11 PM By: Curtis Sites Entered By: Curtis Sites on 08/28/2014 08:49:37 [name] [name] ([id number]) -------------------------------------------------------------------------------- Multi-Disciplinary Care Plan Details Patient Name: Bielicki, [name] [name]. Date of Service: 08/28/2014 8:15 AM Medical Record Number: [id number] Patient Account Number: [id number] Date of Birth/Sex: [birth year] (44 y.o. Female) Treating RN: Curtis Sites Primary Care  Physician: PATIENT, NO Other Clinician: Referring Physician: Treating Physician/Extender: Rudene Re in Treatment: 32 Active Inactive HBO Nursing Diagnoses: Anxiety related to feelings of confinement associated with the hyperbaric oxygen chamber Potential for barotraumas to ears, sinuses, teeth, and lungs or cerebral gas embolism related to changes in atmospheric pressure inside hyperbaric oxygen chamber Potential for oxygen toxicity seizures related to delivery of 100% oxygen at an increased atmospheric pressure Potential for pulmonary oxygen toxicity related to delivery of 100% oxygen at an increased atmospheric pressure Goals: Barotrauma will be prevented during HBO2 Date Initiated: 01/27/2014 Goal Status: Active Patient and/or family will be able to state/discuss factors appropriate to the management of their disease process during treatment Date Initiated: 01/27/2014 Goal Status: Active Patient will tolerate the hyperbaric oxygen therapy treatment Date Initiated: 01/27/2014 Goal Status: Active Patient will tolerate the internal climate of the chamber Date Initiated: 01/27/2014 Goal Status: Active Patient/caregiver will verbalize understanding of HBO goals, rationale, procedures and potential hazards Date Initiated: 01/27/2014 Goal Status: Active Signs and symptoms of pulmonary oxygen toxicity will be recognized and promptly addressed Date Initiated: 01/27/2014 Goal Status: Active Signs and symptoms of seizure will be recognized and promptly addressed ; seizing patients will suffer no harm Date Initiated: 01/27/2014 Goal Status: Active [name] [name] ([id number]) Interventions: Administer decongestants, per  physician orders, prior to HBO2 Administer the correct therapeutic gas delivery based on the patients needs and limitations, per physician order Assess and provide for patientos comfort related to the hyperbaric environment and equalization of  middle ear Assess for signs and symptoms related to adverse events, including but not limited to confinement anxiety, pneumothorax, oxygen toxicity and baurotrauma Assess patient for any history of confinement anxiety Assess patient's knowledge and expectations regarding hyperbaric medicine and provide education related to the hyperbaric environment, goals of treatment and prevention of adverse events Implement protocols to decrease risk of pneumothorax in high risk patients Notes: Abuse / Safety / Falls / Self Care Management Nursing Diagnoses: Potential for falls Goals: Patient will remain injury free Date Initiated: 02/17/2014 Goal Status: Active Patient/caregiver will verbalize/demonstrate measures taken to prevent injury and/or falls Date Initiated: 02/17/2014 Goal Status: Active Patient/caregiver will verbalize/demonstrate understanding of what to do in case of emergency Date Initiated: 02/17/2014 Goal Status: Active Interventions: Assess fall risk on admission and as needed Assess impairment of mobility on admission and as needed per policy Provide education on fall prevention Treatment Activities: Patient referred to home care : 08/28/2014 Notes: Necrotic Tissue Nursing Diagnoses: Impaired tissue integrity related to necrotic/devitalized tissue Bracknell, [name] KetVIVIAN L. ([id number]) Goals: Necrotic/devitalized tissue will be minimized in the wound bed Date Initiated: 01/13/2014 Goal Status: Active Interventions: Assess patient pain level pre-, during and post procedure and prior to discharge Treatment Activities: Apply topical anesthetic as ordered : 08/28/2014 Notes: Orientation to the Wound Care Program Nursing Diagnoses: Knowledge deficit related to the wound healing center program Goals: Patient/caregiver will verbalize understanding of the Wound Healing Center Program Date Initiated: 01/13/2014 Goal Status: Active Interventions: Provide education on orientation to  the wound center Notes: Peripheral Neuropathy Nursing Diagnoses: Knowledge deficit related to disease process and management of peripheral neurovascular dysfunction Goals: Patient/caregiver will verbalize understanding of disease process and disease management Date Initiated: 01/13/2014 Goal Status: Active Interventions: Assess signs and symptoms of neuropathy upon admission and as needed Notes: Electronic Signature(s) Signed: 08/28/2014 5:34:11 PM By: Curtis Sitesorthy, Joanna Entered By: Curtis Sitesorthy, Joanna on 08/28/2014 08:48:37 [name] [name] KetVIVIAN L. ([id number]) [name] [name] KetVIVIAN L. ([id number]) -------------------------------------------------------------------------------- Pain Assessment Details Patient Name: [name] [name] KetVIVIAN L. Date of Service: 08/28/2014 8:15 AM Medical Record Number: [id number] Patient Account Number: [id number] Date of Birth/Sex: 1971/04/04 (44 y.o. Female) Treating RN: Kristin Bruinsanneker, Nancy Primary Care Physician: PATIENT, NO Other Clinician: Referring Physician: Treating Physician/Extender: Rudene ReBritto, Errol Weeks in Treatment: 32 Active Problems Location of Pain Severity and Description of Pain Patient Has Paino No Site Locations With Dressing Change: No Duration of the Pain. Constant / Intermittento Intermittent Rate the pain. Current Pain Level: 0 Worst Pain Level: 6 Least Pain Level: 0 Tolerable Pain Level: 6 Character of Pain Describe the Pain: Aching Pain Management and Medication Current Pain Management: Medication: Yes Cold Application: No Rest: No Massage: No Activity: No T.E.N.S.: No Heat Application: No Leg drop or elevation: No Is the Current Pain Management Inadequate Adequate: How does your pain impact your activities of daily livingo Sleep: No Bathing: No Appetite: No Relationship With Others: No Bladder Continence: No Emotions: No Bowel Continence: No Work: No Toileting: No Drive: No Dressing: No Hobbies: No Goals for Pain  Management no pain [name] [name] KetVIVIAN L. ([id number]) Electronic Signature(s) Signed: 08/28/2014 4:22:36 PM By: Kristin Bruinsanneker, Nancy Entered By: Kristin Bruinsanneker, Nancy on 08/28/2014 08:22:10 [name] [name] KetVIVIAN L. ([id number]) -------------------------------------------------------------------------------- Patient/Caregiver Education Details Patient Name: Gude, [name] KetVIVIAN L. Date of Service: 08/28/2014 8:15 AM Medical Record Number: [id number] Patient Account Number: [id number]  Date of Birth/Gender: Aug 23, 1970 (44 y.o. Female) Treating RN: Kristin Bruinsanneker, Nancy Primary Care Physician: PATIENT, NO Other Clinician: Referring Physician: Treating Physician/Extender: Rudene ReBritto, Errol Weeks in Treatment: 2732 Education Assessment Education Provided To: Patient Education Topics Provided Offloading: Handouts: What is Offloadingo Methods: Demonstration, Explain/Verbal Responses: Reinforcements needed Wound/Skin Impairment: Handouts: Caring for Your Ulcer Methods: Explain/Verbal Responses: Reinforcements needed Electronic Signature(s) Signed: 08/28/2014 4:22:36 PM By: Kristin Bruinsanneker, Nancy Entered By: Kristin Bruinsanneker, Nancy on 08/28/2014 09:01:27 [name] [name] KetVIVIAN L. ([id number]) -------------------------------------------------------------------------------- Wound Assessment Details Patient Name: Wojciak, [name] KetVIVIAN L. Date of Service: 08/28/2014 8:15 AM Medical Record Number: [id number] Patient Account Number: [id number] Date of Birth/Sex: Aug 23, 1970 (44 y.o. Female) Treating RN: Kristin Bruinsanneker, Nancy Primary Care Physician: PATIENT, NO Other Clinician: Referring Physician: Treating Physician/Extender: Rudene ReBritto, Errol Weeks in Treatment: 32 Wound Status Wound Number: 1R Primary Diabetic Wound/Ulcer of the Lower Etiology: Extremity Wound Location: Left Foot - Plantar Wound Open Wounding Event: Gradually Appeared Status: Date Acquired: 10/28/2013 Comorbid Cataracts, Chronic Obstructive Weeks Of Treatment: 32 History:  Pulmonary Disease (COPD), Type II Clustered Wound: No Diabetes Wound Measurements Length: (cm) 0.1 Width: (cm) 0.3 Depth: (cm) 0.1 Area: (cm) 0.024 Volume: (cm) 0.002 % Reduction in Area: 99.7% % Reduction in Volume: 99.8% Epithelialization: Large (67-100%) Tunneling: No Undermining: No Wound Description Classification: Grade 2 Wound Margin: Flat and Intact Exudate Amount: None Present Foul Odor After Cleansing: No Wound Bed Granulation Amount: None Present (0%) Exposed Structure Necrotic Amount: Small (1-33%) Fascia Exposed: No Necrotic Quality: Adherent Slough Fat Layer Exposed: No Tendon Exposed: No Muscle Exposed: No Joint Exposed: No Bone Exposed: No Limited to Skin Breakdown Periwound Skin Texture Texture Color No Abnormalities Noted: No No Abnormalities Noted: No Callus: Yes Atrophie Blanche: No Crepitus: No Cyanosis: No Excoriation: No Ecchymosis: No Fluctuance: No Erythema: No Friable: No Hemosiderin Staining: No Ruscitti, Juliona L. ([id number]) Induration: No Mottled: No Localized Edema: No Pallor: No Rash: No Rubor: No Scarring: No Temperature / Pain Moisture Temperature: No Abnormality No Abnormalities Noted: No Dry / Scaly: Yes Maceration: No Moist: No Wound Preparation Ulcer Cleansing: Rinsed/Irrigated with Saline Topical Anesthetic Applied: Other: lidocaine 4%, Treatment Notes Wound #1R (Left, Plantar Foot) 1. Cleansed with: Clean wound with Normal Saline 2. Anesthetic Topical Lidocaine 4% cream to wound bed prior to debridement 4. Dressing Applied: Foam 6. Footwear/Offloading device applied Felt/Foam Wedge shoe Notes felt in periwound area to assist in offloading Electronic Signature(s) Signed: 08/28/2014 4:22:36 PM By: Kristin Bruinsanneker, Nancy Entered By: Kristin Bruinsanneker, Nancy on 08/28/2014 08:33:02 Nudd, [name] KetVIVIAN L. ([id number]) -------------------------------------------------------------------------------- Wound Assessment  Details Patient Name: Fernholz, [name] KetVIVIAN L. Date of Service: 08/28/2014 8:15 AM Medical Record Number: [id number] Patient Account Number: [id number] Date of Birth/Sex: Aug 23, 1970 (44 y.o. Female) Treating RN: Kristin Bruinsanneker, Nancy Primary Care Physician: PATIENT, NO Other Clinician: Referring Physician: Treating Physician/Extender: Rudene ReBritto, Errol Weeks in Treatment: 32 Wound Status Wound Number: 6 Primary Diabetic Wound/Ulcer of the Lower Etiology: Extremity Wound Location: Right Metatarsal head first - Plantar Wound Open Status: Wounding Event: Gradually Appeared Comorbid Cataracts, Chronic Obstructive Date Acquired: 02/09/2014 History: Pulmonary Disease (COPD), Type II Weeks Of Treatment: 25 Diabetes Clustered Wound: No Wound Measurements Length: (cm) 0.2 Width: (cm) 1 Depth: (cm) 0.1 Area: (cm) 0.157 Volume: (cm) 0.016 % Reduction in Area: 93.8% % Reduction in Volume: 93.7% Epithelialization: Large (67-100%) Tunneling: No Undermining: No Wound Description Classification: Grade 3 Wagner Verification: MRI Wound Margin: Flat and Intact Exudate Amount: None Present Foul Odor After Cleansing: No Wound Bed Granulation Amount: Small (1-33%) Exposed Structure Granulation Quality: Red Fascia Exposed: No Necrotic Amount: Small (1-33%)  Fat Layer Exposed: No Necrotic Quality: Adherent Slough Tendon Exposed: No Muscle Exposed: No Joint Exposed: No Bone Exposed: No Limited to Skin Breakdown Periwound Skin Texture Texture Color No Abnormalities Noted: No No Abnormalities Noted: No Callus: Yes Atrophie Blanche: No Crepitus: No Cyanosis: No Excoriation: No Ecchymosis: No Fluctuance: No Erythema: No Gebert, Destanee L. ([id number]) Friable: No Hemosiderin Staining: No Induration: No Mottled: No Localized Edema: No Pallor: No Rash: No Rubor: No Scarring: No Temperature / Pain Moisture Temperature: No Abnormality No Abnormalities Noted: No Dry / Scaly:  Yes Maceration: No Moist: No Wound Preparation Ulcer Cleansing: Rinsed/Irrigated with Saline Topical Anesthetic Applied: Other: lidocaine 4%, Treatment Notes Wound #6 (Right, Plantar Metatarsal head first) 1. Cleansed with: Clean wound with Normal Saline 2. Anesthetic Topical Lidocaine 4% cream to wound bed prior to debridement 4. Dressing Applied: Foam 6. Footwear/Offloading device applied Felt/Foam Wedge shoe Notes felt in periwound area to assist in offloading Electronic Signature(s) Signed: 08/28/2014 4:22:36 PM By: Kristin Bruins Entered By: Kristin Bruins on 08/28/2014 08:33:44 Leven, [name] [name] ([id number]) -------------------------------------------------------------------------------- Vitals Details Patient Name: Ventola, [name] [name]. Date of Service: 08/28/2014 8:15 AM Medical Record Number: [id number] Patient Account Number: [id number] Date of Birth/Sex: 07-Apr-1971 (44 y.o. Female) Treating RN: Kristin Bruins Primary Care Physician: PATIENT, NO Other Clinician: Referring Physician: Treating Physician/Extender: Rudene Re in Treatment: 32 Vital Signs Time Taken: 08:20 Temperature (F): 98 Height (in): 76 Pulse (bpm): 84 Weight (lbs): 132 Respiratory Rate (breaths/min): 16 Body Mass Index (BMI): 16.1 Blood Pressure (mmHg): 125/87 Reference Range: 80 - 120 mg / dl Notes pt states blood sugar has been 100-110 Electronic Signature(s) Signed: 08/28/2014 4:22:36 PM By: Kristin Bruins Entered By: Kristin Bruins on 08/28/2014 08:22:56

## 2014-09-04 ENCOUNTER — Encounter: Payer: Medicare PPO | Admitting: Surgery

## 2014-09-04 DIAGNOSIS — L97512 Non-pressure chronic ulcer of other part of right foot with fat layer exposed: Secondary | ICD-10-CM | POA: Diagnosis not present

## 2014-09-05 NOTE — Progress Notes (Signed)
[name] [name] CRESSLER ([id number]) Visit Report for 09/04/2014 Chief Complaint Document Details Patient Name: [name] [name] BOEHME. Date of Service: 09/04/2014 8:00 AM Medical Record Number: [id number] Patient Account Number: [id number] Date of Birth/Sex: November 26, 1970 (44 y.o. Female) Treating RN: Primary Care Physician: PATIENT, NO Other Clinician: Referring Physician: Treating Physician/Extender: Rudene Re in Treatment: 5 Information Obtained from: Patient Chief Complaint She has wounds both feet plantar aspect over the 1st metatarsals. She and her husband say she does not walk much and she does not lay her foot against a hard surface. Most of the time she is on the bed. She has seen podiatry and has diabetic shoes. Her sugar is better controlled at present. no pain and no discharge from the wounds. to last week's discussion she says she has been in bed almost the entire weekend and has not been on her feet as much. She is also wearing her special Darco shoes. She is also been very compliant with her diabetic control. Electronic Signature(s) Signed: 09/04/2014 12:32:56 PM By: Evlyn Kanner MD, FACS Entered By: Evlyn Kanner on 09/04/2014 08:46:59 [name], [name] [name] ([id number]) -------------------------------------------------------------------------------- Debridement Details Patient Name: [name] [name]. Date of Service: 09/04/2014 8:00 AM Medical Record Number: [id number] Patient Account Number: [id number] Date of Birth/Sex: 12/09/1970 (44 y.o. Female) Treating RN: Primary Care Physician: PATIENT, NO Other Clinician: Referring Physician: Treating Physician/Extender: Rudene Re in Treatment: 33 Debridement Performed for Wound #6 Right,Plantar Metatarsal head first Assessment: Performed By: Physician Tristan Schroeder., MD Debridement: Open Wound/Selective Debridement Selective Description: Pre-procedure Yes Verification/Time Out Taken: Start  Time: 08:33 Pain Control: Lidocaine 4% Topical Solution Total Area Debrided (L x 1.2 (cm) x 1.5 (cm) = 1.8 (cm) W): Tissue and other Non-Viable, Callus material debrided: Instrument: Forceps, Scissors Bleeding: None End Time: 08:39 Procedural Pain: 0 Post Procedural Pain: 0 Response to Treatment: Procedure was tolerated well Post Debridement Measurements of Total Wound Length: (cm) 0.1 Width: (cm) 0.1 Depth: (cm) 0.1 Volume: (cm) 0.001 Electronic Signature(s) Signed: 09/04/2014 12:32:56 PM By: Evlyn Kanner MD, FACS Entered By: Evlyn Kanner on 09/04/2014 08:46:50 [name] [name] ([id number]) -------------------------------------------------------------------------------- HPI Details Patient Name: [name] [name]. Date of Service: 09/04/2014 8:00 AM Medical Record Number: [id number] Patient Account Number: [id number] Date of Birth/Sex: 1970-12-27 (44 y.o. Female) Treating RN: Primary Care Physician: PATIENT, NO Other Clinician: Referring Physician: Treating Physician/Extender: Rudene Re in Treatment: 32 History of Present Illness HPI Description: [name] [name] is a 8F w/ h/o DM who presents with wounds on B/L feet. She states that she has the wounds for months (she first presented in 01/13/14). She has had them debrided and had been dressing them with silvadene at the time or presentation. Her sugars are usually in the low 200s. She has an upcoming appointment to draw a HgBA1C. She has a h/o L femoral artery injury during a hernia repair (2003). The wound closure at that time required a muscle flap and post-op HBO treatment. We got an MRI of her right footwhich was worrisome for soft tissue infection as well as adjacent osteomyelitis. This is in regards to soft tissue ulceration medial to the first metatarsal head. A a subsequent bone scan of the right foot also confirm these results. The patient is here for followup today.she denies any fevers at home. she is  compliant with her dressing changes. She recently had myryngotomy tubes placed in her R ear. later she also had a myringotomy tube placed in the left ear. On careful questioning she says she does not walk  around the house much at all. In the day she may be awake for about an hour and during the night she may be awake for about 2 or 3 hours and she does walk around at that time. 06/12/14 -- since I had a detailed discussion with them last week and now ground rules about how she should be walking with a Darco shoes and how we are she should be on her feet she and her husband say that they have been very compliant. sugars were often high for a while but I believe they change the machine and it is much better now. overall she is doing much better than last week. 06/19/14 - she has been doing very well staying off her feet and has no fresh issues. 07/10/2014 the patient and her daughter say that she is being very compliant with not having her feet on the ground without her offloading shoes and she is not walking around too much. Her blood sugars also been running well. 06/19/14 -- her husband estimates that she is on her feet only about an hour a day and other than that she is always in either a recumbent position of her feet off the ground. Doing fine otherwise and has no fresh issues. 06/26/14 -- she continues to stay off her feet and has for her blood sugar goes she says she has not had any recent hemoglobin A1c done. 07/04/2014 -- a few days ago her sugar went up very high and she had to be seen in the emergency room and it had to be taken care of. This may have been due to an inadvertent skipping of her insulin dose. other than that she is now back to her baseline. 07/24/2014 -- the patient has not come to the wound center for several days now and she has missed [name], [name] L. ([id number]) several of her HBO treatments. He had asked her to see her PCP but she has not done so yet. She  says she's been febrile and has had shivering and body ache. Does say she's been off her feet. 07/31/2014 -- he has gone to her PCP and they have cut her pain medications in half and they have also not she is hypothyroid and given her some medications and blood tests for this. she says she's feeling much better now. She says she's been compliant with her dark offloading shoes and she would like to take her last 4 treatments next week for hyperbaric oxygen therapy. 08/14/2014 -- she had a injury to her right foot when she tripped over the edge of the bed and says she had her offloading shoes on but I doubt because the site of the injury is not in keeping with her having her offloading shoe on. the patient generally has not been very compliant with all instructions. 08/21/2014 -- her husband is with her today and in the course of conversation it has been clearly demonstrated that the patient does not use her offloading shoes at all while she is at home. She walks with her bandages and has no footwear whatsoever. This is the first time the truth has come out because all along their been telling me that they are using the Darco offloading shoes form morning till night. he has also said that her sugars are running between 190-200 and I have again told them that this needs to be much better controlled as they have not been following up with their PCP regularly. 08/28/2014 -- the patient says she's  been very compliant and her blood sugars are now in the low 100s. She also says that 90% of the time she is wearing a Darco shoe and has not been walking with her bare feet. Electronic Signature(s) Signed: 09/04/2014 12:32:56 PM By: Evlyn KannerBritto, Karcyn Menn MD, FACS Entered By: Evlyn KannerBritto, Maycee Blasco on 09/04/2014 08:47:07 Mcquillen, [name] KetVIVIAN L. ([id number]) -------------------------------------------------------------------------------- Physical Exam Details Patient Name: Vanmeter, Bobbye L. Date of Service: 09/04/2014 8:00  AM Medical Record Number: [id number] Patient Account Number: [id number] Date of Birth/Sex: [birth year] (44 y.o. Female) Treating RN: Primary Care Physician: PATIENT, NO Other Clinician: Referring Physician: Treating Physician/Extender: Rudene ReBritto, Aneliz Carbary Weeks in Treatment: 33 Constitutional . Pulse regular. Respirations normal and unlabored. Afebrile. . Eyes Nonicteric. Reactive to light. Ears, Nose, Mouth, and Throat Lips, teeth, and gums WNL.Marland Kitchen. Moist mucosa without lesions . Neck supple and nontender. No palpable supraclavicular or cervical adenopathy. Normal sized without goiter. Respiratory WNL. No retractions.. Cardiovascular Pedal Pulses WNL. No clubbing, cyanosis or edema. Integumentary (Hair, Skin) the left leg has completely healed and there is no plantar ulceration. On the right plantar aspect after removing as much as callous is possible there is a very tiny area possibly 0.1 x 0.1 x 0.1 cm which is open.. No crepitus or fluctuance. No peri-wound warmth or erythema. No masses.Marland Kitchen. Psychiatric Judgement and insight Intact.. No evidence of depression, anxiety, or agitation.. Electronic Signature(s) Signed: 09/04/2014 12:32:56 PM By: Evlyn KannerBritto, Nyiesha Beever MD, FACS Entered By: Evlyn KannerBritto, Lucerito Rosinski on 09/04/2014 08:48:11 Wyse, [name] KetVIVIAN L. ([id number]) -------------------------------------------------------------------------------- Physician Orders Details Patient Name: Leider, [name] KetVIVIAN L. Date of Service: 09/04/2014 8:00 AM Medical Record Number: [id number] Patient Account Number: [id number] Date of Birth/Sex: [birth year] (44 y.o. Female) Treating RN: Curtis Sitesorthy, Joanna Primary Care Physician: PATIENT, NO Other Clinician: Referring Physician: Treating Physician/Extender: Rudene ReBritto, Dionte Blaustein Weeks in Treatment: 5733 Verbal / Phone Orders: Yes Clinician: Curtis Sitesorthy, Joanna Read Back and Verified: Yes Diagnosis Coding Wound Cleansing Wound #6 Right,Plantar Metatarsal head first o Clean wound with  Normal Saline. Anesthetic Wound #6 Right,Plantar Metatarsal head first o Topical Lidocaine 4% cream applied to wound bed prior to debridement Primary Wound Dressing Wound #6 Right,Plantar Metatarsal head first o Foam o Other: - felt Secondary Dressing Wound #6 Right,Plantar Metatarsal head first o Conform/Kerlix Dressing Change Frequency Wound #6 Right,Plantar Metatarsal head first o Change dressing every other day. Follow-up Appointments Wound #6 Right,Plantar Metatarsal head first o Return Appointment in 1 week. Off-Loading Wound #6 Right,Plantar Metatarsal head first o Other: - darco Front Loading with peg assist Additional Orders / Instructions Wound #6 Right,Plantar Metatarsal head first o Other: - Keep pressure off of feet Valido, [name] KetVIVIAN L. ([id number]) Electronic Signature(s) Signed: 09/04/2014 12:32:56 PM By: Evlyn KannerBritto, Dawayne Ohair MD, FACS Signed: 09/04/2014 4:34:18 PM By: Curtis Sitesorthy, Joanna Entered By: Curtis Sitesorthy, Joanna on 09/04/2014 08:44:54 Cerasoli, [name] KetVIVIAN L. ([id number]) -------------------------------------------------------------------------------- Problem List Details Patient Name: Porche, Alyha L. Date of Service: 09/04/2014 8:00 AM Medical Record Number: [id number] Patient Account Number: [id number] Date of Birth/Sex: [birth year] (44 y.o. 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 Date of Service: 09/04/2014 8:00 AM Medical Record Number: [id number] Patient Account Number: [id number] Date of Birth/Sex: 24-Dec-1970 (44 y.o. Female) Treating RN: Primary Care Physician: PATIENT, NO Other Clinician: Referring Physician: Treating Physician/Extender: Rudene Re in Treatment: 88 Subjective Chief Complaint Information obtained from Patient She has wounds both feet plantar aspect over the 1st metatarsals. She and her husband say she does not walk much and she does not lay her foot against a hard surface. Most of the time she is on the bed. She has seen podiatry and has diabetic shoes. Her sugar is better controlled at present. no pain and no discharge from the wounds. to last week's discussion she says she has been in bed almost the entire weekend and has not been on her feet as much. She is also wearing her special Darco shoes. She is also been very compliant with her diabetic control. History of Present Illness (HPI) Sophina is a 29F w/ h/o DM who presents with wounds on B/L feet. She states that she has the wounds for months (she first presented in 01/13/14). She has had them debrided and had been dressing them with silvadene at the time or presentation. Her sugars are usually in the low 200s. She has an upcoming appointment to draw a HgBA1C. She has a h/o L femoral artery injury during a hernia repair (2003).  The wound closure at that time required a muscle flap and post-op HBO treatment. We got an MRI of her right footwhich was worrisome for soft tissue infection as well as adjacent osteomyelitis. This is in regards to soft tissue ulceration medial to the first metatarsal head. A a subsequent bone scan of the right foot also confirm these results. The patient is here for followup today.she denies any fevers at home. she is compliant with her dressing changes. She recently had myryngotomy tubes placed in her R ear. later she also had a myringotomy tube placed in the left ear. On careful questioning she says she does not walk around the house much at all. In the day she may be awake for about an hour and during the night she may be awake for about 2 or 3 hours and she does walk around at that time. 06/12/14 -- since I had a detailed discussion with them last week and now ground rules about how she should be walking with a Darco shoes and how we are she should be on her feet she and her husband say that they have been very compliant. sugars were often high for a while but I believe they change the machine and it is much better now. overall she is doing much better than last week. 06/19/14 - she has been doing very well staying off her feet and has no fresh issues. 07/10/2014 the patient and her daughter say that she is being very compliant with not having her feet on the ground without her offloading shoes and she is not walking around too much. Her blood sugars also been running well. Hund, REVELLA SHELTON ([id number]) 06/19/14 -- her husband estimates that she is on her feet only about an hour a day and other than that she is always in either  a recumbent position of her feet off the ground. Doing fine otherwise and has no fresh issues. 06/26/14 -- she continues to stay off her feet and has for her blood sugar goes she says she has not had any recent hemoglobin A1c done. 07/04/2014 -- a few days ago  her sugar went up very high and she had to be seen in the emergency room and it had to be taken care of. This may have been due to an inadvertent skipping of her insulin dose. other than that she is now back to her baseline. 07/24/2014 -- the patient has not come to the wound center for several days now and she has missed several of her HBO treatments. He had asked her to see her PCP but she has not done so yet. She says she's been febrile and has had shivering and body ache. Does say she's been off her feet. 07/31/2014 -- he has gone to her PCP and they have cut her pain medications in half and they have also not she is hypothyroid and given her some medications and blood tests for this. she says she's feeling much better now. She says she's been compliant with her dark offloading shoes and she would like to take her last 4 treatments next week for hyperbaric oxygen therapy. 08/14/2014 -- she had a injury to her right foot when she tripped over the edge of the bed and says she had her offloading shoes on but I doubt because the site of the injury is not in keeping with her having her offloading shoe on. the patient generally has not been very compliant with all instructions. 08/21/2014 -- her husband is with her today and in the course of conversation it has been clearly demonstrated that the patient does not use her offloading shoes at all while she is at home. She walks with her bandages and has no footwear whatsoever. This is the first time the truth has come out because all along their been telling me that they are using the Darco offloading shoes form morning till night. he has also said that her sugars are running between 190-200 and I have again told them that this needs to be much better controlled as they have not been following up with their PCP regularly. 08/28/2014 -- the patient says she's been very compliant and her blood sugars are now in the low 100s. She also says that 90% of the  time she is wearing a Darco shoe and has not been walking with her bare feet. Objective Constitutional Pulse regular. Respirations normal and unlabored. Afebrile. Vitals Time Taken: 8:20 AM, Height: 76 in, Weight: 132 lbs, BMI: 16.1, Temperature: 97.7 F, Pulse: 94 bpm, Respiratory Rate: 16 breaths/min, Blood Pressure: 128/87 mmHg. Eyes Nonicteric. Reactive to light. Rockman, [name] [name] ([id number]) Ears, Nose, Mouth, and Throat Lips, teeth, and gums WNL.Marland Kitchen Moist mucosa without lesions . Neck supple and nontender. No palpable supraclavicular or cervical adenopathy. Normal sized without goiter. Respiratory WNL. No retractions.. Cardiovascular Pedal Pulses WNL. No clubbing, cyanosis or edema. Psychiatric Judgement and insight Intact.. No evidence of depression, anxiety, or agitation.. Integumentary (Hair, Skin) the left leg has completely healed and there is no plantar ulceration. On the right plantar aspect after removing as much as callous is possible there is a very tiny area possibly 0.1 x 0.1 x 0.1 cm which is open.. No crepitus or fluctuance. No peri-wound warmth or erythema. No masses.. Wound #1R status is Healed - Epithelialized. Original cause of wound  was Gradually Appeared. The wound is located on the Left,Plantar Foot. The wound measures 0cm length x 0cm width x 0cm depth; 0cm^2 area and 0cm^3 volume. The wound is limited to skin breakdown. There is a none present amount of drainage noted. The wound margin is flat and intact. There is no granulation within the wound bed. There is a large (67-100%) amount of necrotic tissue within the wound bed including Eschar. The periwound skin appearance exhibited: Callus, Dry/Scaly. The periwound skin appearance did not exhibit: Crepitus, Excoriation, Fluctuance, Friable, Induration, Localized Edema, Rash, Scarring, Maceration, Moist, Atrophie Blanche, Cyanosis, Ecchymosis, Hemosiderin Staining, Mottled, Pallor, Rubor, Erythema.  Periwound temperature was noted as No Abnormality. Wound #6 status is Open. Original cause of wound was Gradually Appeared. The wound is located on the Right,Plantar Metatarsal head first. The wound measures 0.2cm length x 0.2cm width x 0.1cm depth; 0.031cm^2 area and 0.003cm^3 volume. The wound is limited to skin breakdown. There is a none present amount of drainage noted. The wound margin is flat and intact. There is small (1-33%) granulation within the wound bed. There is a small (1-33%) amount of necrotic tissue within the wound bed including Eschar. The periwound skin appearance exhibited: Callus, Dry/Scaly. The periwound skin appearance did not exhibit: Crepitus, Excoriation, Fluctuance, Friable, Induration, Localized Edema, Rash, Scarring, Maceration, Moist, Atrophie Blanche, Cyanosis, Ecchymosis, Hemosiderin Staining, Mottled, Pallor, Rubor, Erythema. Periwound temperature was noted as No Abnormality. The left leg has completely healed and there is no plantar ulceration. On the right plantar aspect after removing as much as callous is possible there is a very tiny area possibly 0.1 x 0.1 x 0.1 cm which is open. 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 The right leg has minimal area open which is tiny. I have spent regular of time discussing with the patient and her husband the need for proper foot care including a podiatry consult to see that the tips of the nails are sharply cut appropriately  and I have also discussed the need for diabetic shoes which should be ordered at this stage. We have given them the name of [employer] at [facility] and have recommended that the primary care physician who treats her diabetes should make the appropriate consult. The patient and husband understand fully well. in the meanwhile we will continue with local care with foam padding and a ferret circular piece of padding to offload appropriately with the Darco front offloading shoe. She will come back and see me next week. Procedures Wound #6 Wound #6 is a Diabetic Wound/Ulcer of the Lower Extremity located on the Right,Plantar Metatarsal head first . There was an Open Wound debridement with total area of 1.8 sq cm performed by Brigido Mera, Ignacia Felling., MD. with the following instrument(s): Forceps and Scissors to remove Non-Viable tissue/material including Callus after achieving pain control using Lidocaine 4% Topical Solution. A time out was conducted prior to the start of the procedure. There was no bleeding. The procedure was tolerated well with a pain level of 0 throughout and a pain level of  0 following the procedure. Post Debridement Measurements: 0.1cm length x 0.1cm width x 0.1cm depth; 0.001cm^3 volume. Smeltzer, [name] [name] ([id number]) Plan Wound Cleansing: Wound #6 Right,Plantar Metatarsal head first: Clean wound with Normal Saline. Anesthetic: Wound #6 Right,Plantar Metatarsal head first: Topical Lidocaine 4% cream applied to wound bed prior to debridement Primary Wound Dressing: Wound #6 Right,Plantar Metatarsal head first: Foam Other: - felt Secondary Dressing: Wound #6 Right,Plantar Metatarsal head first: Conform/Kerlix Dressing Change Frequency: Wound #6 Right,Plantar Metatarsal head first: Change dressing every other day. Follow-up Appointments: Wound #6 Right,Plantar Metatarsal head first: Return Appointment in 1 week. Off-Loading: Wound #6  Right,Plantar Metatarsal head first: Other: - darco Front Loading with peg assist Additional Orders / Instructions: Wound #6 Right,Plantar Metatarsal head first: Other: - Keep pressure off of feet I will consider the left leg healed. The right leg has minimal area open which is tiny. I have spent regular of time discussing with the patient and her husband the need for proper foot care including a podiatry consult to see that the tips of the nails are sharply cut appropriately and I have also discussed the need for diabetic shoes which should be ordered at this stage. We have given them the name of [facility] at [facility] and have recommended that the primary care physician who treats her diabetes should make the appropriate consult. The patient and husband understand fully well. in the meanwhile we will continue with local care with foam padding and a ferret circular piece of padding to offload appropriately with the [employer] front offloading shoe. She will come back and see me next week. Batchelder, [name] [name] ([id number]) Electronic Signature(s) Signed: 09/04/2014 4:37:19 PM By: Evlyn Kanner MD, FACS Previous Signature: 09/04/2014 12:32:56 PM Version By: Evlyn Kanner MD, FACS Entered By: Evlyn Kanner on 09/04/2014 16:35:38 Kitt, [name] [name] ([id number]) -------------------------------------------------------------------------------- SuperBill Details Patient Name: Fleener, [name] [name]. Date of Service: 09/04/2014 Medical Record Number: [id number] Patient Account Number: [id number] Date of Birth/Sex: 13-Aug-1970 (44 y.o. 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 osteomyelitis, right ankle and foot Facility Procedures CPT4 Code Description: 62952841 97597 - DEBRIDE WOUND 1ST 20 SQ CM OR < ICD-10 Description Diagnosis L97.512 Non-pressure chronic ulcer of other part of right foot E11.621 Type 2 diabetes mellitus with foot ulcer Modifier: with fat la Quantity: 1 yer exposed Physician Procedures CPT4 Code Description: 3244010 97597 - WC PHYS DEBR WO ANESTH 20 SQ CM ICD-10 Description Diagnosis L97.512 Non-pressure chronic ulcer of other part of right foot E11.621 Type 2 diabetes mellitus with foot ulcer Modifier: with fat lay Quantity: 1 er exposed Electronic Signature(s) Signed: 09/04/2014 12:32:56 PM By: Evlyn Kanner MD, FACS Entered By: Evlyn Kanner on 09/04/2014 08:51:14

## 2014-09-05 NOTE — Progress Notes (Addendum)
Meckler, [name] ([id number]) Visit Report for 09/04/2014 Arrival Information Details Patient Name: [name] [name], [name] Date of Service: 09/04/2014 8:00 AM Medical Record Number: [id number] Patient Account Number: [id number] Date of Birth/Sex: 09/29/1970 (44 y.o. Female) Treating RN: Huel CoventryWoody, Kim Primary Care Physician: PATIENT, NO Other Clinician: Referring Physician: Treating Physician/Extender: Rudene ReBritto, Errol Weeks in Treatment: 2533 Visit Information History Since Last Visit Added or deleted any medications: No Patient Arrived: Ambulatory Any new allergies or adverse reactions: No Arrival Time: 08:18 Had a fall or experienced change in No Accompanied By: husband activities of daily living that may affect Transfer Assistance: None risk of falls: Patient Identification Verified: Yes Signs or symptoms of abuse/neglect since last No Secondary Verification Process Yes visito Completed: Hospitalized since last visit: No Patient Requires Transmission-Based No Has Dressing in Place as Prescribed: Yes Precautions: Has Footwear/Offloading in Place as Yes Patient Has Alerts: Yes Prescribed: Left: Wedge Shoe Right: Wedge Shoe Pain Present Now: No Electronic Signature(s) Signed: 09/04/2014 4:39:41 PM By: Elliot GurneyWoody, RN, BSN, Kim RN, BSN Entered By: Elliot GurneyWoody, RN, BSN, Kim on 09/04/2014 08:19:07 Bielby, [name] ([id number]) -------------------------------------------------------------------------------- Encounter Discharge Information Details Patient Name: [name] [name], [name] Date of Service: 09/04/2014 8:00 AM Medical Record Number: [id number] Patient Account Number: [id number] Date of Birth/Sex: 09/29/1970 (44 y.o. Female) Treating RN: Huel CoventryWoody, Kim Primary Care Physician: PATIENT, NO Other Clinician: Referring Physician: Treating Physician/Extender: Rudene ReBritto, Errol Weeks in Treatment: 5333 Encounter Discharge Information Items Discharge Pain Level: 0 Discharge Condition:  Stable Ambulatory Status: Ambulatory Discharge Destination: Home Transportation: Private Auto Accompanied By: husband, Tasia Catchingsraig Schedule Follow-up Appointment: Yes Medication Reconciliation completed and provided to Patient/Care Yes Mcihael Hinderman: Provided on Clinical Summary of Care: 09/04/2014 Form Type Recipient Paper Patient [facility] Electronic Signature(s) Signed: 09/04/2014 8:53:13 AM By: Gwenlyn PerkingMoore, Shelia Entered By: Gwenlyn PerkingMoore, Shelia on 09/04/2014 08:53:13 Christofferson, [name] ([id number]) -------------------------------------------------------------------------------- Lower Extremity Assessment Details Patient Name: [name] [name], [name] Date of Service: 09/04/2014 8:00 AM Medical Record Number: [id number] Patient Account Number: [id number] Date of Birth/Sex: 09/29/1970 (44 y.o. Female) Treating RN: Huel CoventryWoody, Kim Primary Care Physician: PATIENT, NO Other Clinician: Referring Physician: Treating Physician/Extender: Rudene ReBritto, Errol Weeks in Treatment: 33 Vascular Assessment Pulses: Posterior Tibial Dorsalis Pedis Palpable: [Left:Yes] [Right:Yes] Extremity colors, hair growth, and conditions: Extremity Color: [Left:Dusky] [Right:Dusky] Hair Growth on Extremity: [Left:No] [Right:No] Temperature of Extremity: [Left:Cool] [Right:Cool] Capillary Refill: [Left:> 3 seconds] [Right:> 3 seconds] Toe Nail Assessment Left: Right: Thick: Yes Yes Discolored: Yes Yes Deformed: Yes Yes Improper Length and Hygiene: Yes Yes Electronic Signature(s) Signed: 09/04/2014 4:39:41 PM By: Elliot GurneyWoody, RN, BSN, Kim RN, BSN Entered By: Elliot GurneyWoody, RN, BSN, Kim on 09/04/2014 84:16:6008:28:27 [place], [name] ([id number]) -------------------------------------------------------------------------------- Multi Wound Chart Details Patient Name: [name] [name], [name] Date of Service: 09/04/2014 8:00 AM Medical Record Number: [id number] Patient Account Number: [id number] Date of Birth/Sex: 09/29/1970 (44 y.o. Female) Treating RN:  Curtis Sitesorthy, Joanna Primary Care Physician: PATIENT, NO Other Clinician: Referring Physician: Treating Physician/Extender: Rudene ReBritto, Errol Weeks in Treatment: 33 Vital Signs Height(in): 76 Pulse(bpm): 94 Weight(lbs): 132 Blood Pressure 128/87 (mmHg): Body Mass Index(BMI): 16 Temperature(F): 97.7 Respiratory Rate 16 (breaths/min): Photos: [1R:No Photos] [6:No Photos] [N/A:N/A] Wound Location: [1R:Left Foot - Plantar] [6:Right Metatarsal head first N/A - Plantar] Wounding Event: [1R:Gradually Appeared] [6:Gradually Appeared] [N/A:N/A] Primary Etiology: [1R:Diabetic Wound/Ulcer of Diabetic Wound/Ulcer of N/A the Lower Extremity] [6:the Lower Extremity] Comorbid History: [1R:Cataracts, Chronic Obstructive Pulmonary Disease (COPD), Type II Disease (COPD), Type II Diabetes] [6:Cataracts, Chronic Obstructive Pulmonary Diabetes] [N/A:N/A] Date Acquired: [1R:10/28/2013] [6:02/09/2014] [N/A:N/A] Weeks of Treatment: [1R:33] [6:26] [N/A:N/A] Wound Status: [1R:Open] [  6:Open] [N/A:N/A] Wound Recurrence: [1R:Yes] [6:No] [N/A:N/A] Measurements L x W x D 0.4x1x0.1 [6:1.2x1.5x0.1] [N/A:N/A] (cm) Area (cm) : [1R:0.314] [6:1.414] [N/A:N/A] Volume (cm) : [1R:0.031] [6:0.141] [N/A:N/A] % Reduction in Area: [1R:96.70%] [6:44.10%] [N/A:N/A] % Reduction in Volume: 96.70% [6:44.30%] [N/A:N/A] Classification: [1R:Grade 2] [6:Grade 3] [N/A:N/A] Wagner Verification: [1R:N/A] [6:MRI] [N/A:N/A] Exudate Amount: [1R:None Present] [6:None Present] [N/A:N/A] Wound Margin: [1R:Flat and Intact] [6:Flat and Intact] [N/A:N/A] Granulation Amount: [1R:None Present (0%)] [6:None Present (0%)] [N/A:N/A] Necrotic Amount: [1R:Large (67-100%)] [6:Large (67-100%)] [N/A:N/A] Necrotic Tissue: [1R:Eschar] [6:Eschar] [N/A:N/A] Exposed Structures: [1R:Fascia: No Fat: No Tendon: No] [6:Fascia: No Fat: No Tendon: No] [N/A:N/A] Muscle: No Muscle: No Joint: No Joint: No Bone: No Bone: No Limited to Skin Limited to  Skin Breakdown Breakdown Epithelialization: Large (67-100%) Large (67-100%) N/A Periwound Skin Texture: Callus: Yes Callus: Yes N/A Edema: No Edema: No Excoriation: No Excoriation: No Induration: No Induration: No Crepitus: No Crepitus: No Fluctuance: No Fluctuance: No Friable: No Friable: No Rash: No Rash: No Scarring: No Scarring: No Periwound Skin Dry/Scaly: Yes Dry/Scaly: Yes N/A Moisture: Maceration: No Maceration: No Moist: No Moist: No Periwound Skin Color: Atrophie Blanche: No Atrophie Blanche: No N/A Cyanosis: No Cyanosis: No Ecchymosis: No Ecchymosis: No Erythema: No Erythema: No Hemosiderin Staining: No Hemosiderin Staining: No Mottled: No Mottled: No Pallor: No Pallor: No Rubor: No Rubor: No Temperature: No Abnormality No Abnormality N/A Tenderness on No No N/A Palpation: Wound Preparation: Ulcer Cleansing: Ulcer Cleansing: N/A Rinsed/Irrigated with Rinsed/Irrigated with Saline Saline Topical Anesthetic Topical Anesthetic Applied: Other: lidocaine Applied: Other: lidocaine 4% 4% Treatment Notes Electronic Signature(s) Signed: 09/04/2014 4:34:18 PM By: Curtis Sites Entered By: Curtis Sites on 09/04/2014 08:41:38 Bayly, [name] ([id number]) -------------------------------------------------------------------------------- Multi-Disciplinary Care Plan Details Patient Name: Meinecke, [name] Date of Service: 09/04/2014 8:00 AM Medical Record Number: [id number] Patient Account Number: [id number] Date of Birth/Sex: 1971/03/21 (44 y.o. Female) Treating RN: Curtis Sites Primary Care Physician: PATIENT, NO Other Clinician: Referring Physician: Treating Physician/Extender: Rudene Re in Treatment: 58 Active Inactive Electronic Signature(s) Signed: 11/26/2014 2:40:34 PM By: Curtis Sites Signed: 11/27/2014 8:29:00 AM By: Elliot Gurney RN, BSN, Kim RN, BSN Previous Signature: 09/04/2014 4:34:18 PM Version By: Curtis Sites Entered By: Elliot Gurney RN, BSN, Kim on 10/21/2014 17:08:16 Colcord, [name] ([id number]) -------------------------------------------------------------------------------- Pain Assessment Details Patient Name: Stolarz, Ardice L. Date of Service: 09/04/2014 8:00 AM Medical Record Number: [id number] Patient Account Number: [id number] Date of Birth/Sex: July 16, 1970 (44 y.o. Female) Treating RN: Huel Coventry Primary Care Physician: PATIENT, NO Other Clinician: Referring Physician: Treating Physician/Extender: Rudene Re in Treatment: 55 Active Problems Location of Pain Severity and Description of Pain Patient Has Paino No Site Locations Pain Management and Medication Current Pain Management: Electronic Signature(s) Signed: 09/04/2014 4:39:41 PM By: Elliot Gurney, RN, BSN, Kim RN, BSN Entered By: Elliot Gurney, RN, BSN, Kim on 09/04/2014 08:19:20 Mentor, [name] ([id number]) -------------------------------------------------------------------------------- Patient/Caregiver Education Details Patient Name: Mcneary, [name] Date of Service: 09/04/2014 8:00 AM Medical Record Number: [id number] Patient Account Number: [id number] Date of Birth/Gender: 08-10-1970 (44 y.o. Female) Treating RN: Curtis Sites Primary Care Physician: PATIENT, NO Other Clinician: Referring Physician: Treating Physician/Extender: Rudene Re in Treatment: 52 Education Assessment Education Provided To: Patient and Caregiver Education Topics Provided Offloading: Handouts: Other: need for diabetic shoes Methods: Explain/Verbal Responses: State content correctly Electronic Signature(s) Signed: 09/04/2014 4:34:18 PM By: Curtis Sites Entered By: Curtis Sites on 09/04/2014 08:45:22 Osier, [name] ([id number]) -------------------------------------------------------------------------------- Wound Assessment Details Patient Name: Hartfield, [name] Date of Service: 09/04/2014 8:00  AM Medical Record Number: [id number]  Patient Account Number: [id number] Date of Birth/Sex: [birth year] (43 y.o. Female) Treating RN: Curtis Sitesorthy, Joanna Primary Care Physician: PATIENT, NO Other Clinician: Referring Physician: Treating Physician/Extender: Rudene ReBritto, Errol Weeks in Treatment: 33 Wound Status Wound Number: 1R Primary Diabetic Wound/Ulcer of the Lower Etiology: Extremity Wound Location: Left, Plantar Foot Wound Healed - Epithelialized Wounding Event: Gradually Appeared Status: Date Acquired: 10/28/2013 Comorbid Cataracts, Chronic Obstructive Weeks Of Treatment: 33 History: Pulmonary Disease (COPD), Type II Clustered Wound: No Diabetes Photos Photo Uploaded By: Elliot GurneyWoody, RN, BSN, Kim on 09/04/2014 12:27:44 Wound Measurements Length: (cm) 0 Width: (cm) 0 Depth: (cm) 0 Area: (cm) 0 Volume: (cm) 0 % Reduction in Area: 100% % Reduction in Volume: 100% Epithelialization: Large (67-100%) Wound Description Classification: Grade 2 Wound Margin: Flat and Intact Exudate Amount: None Present Foul Odor After Cleansing: No Wound Bed Granulation Amount: None Present (0%) Exposed Structure Necrotic Amount: Large (67-100%) Fascia Exposed: No Necrotic Quality: Eschar Fat Layer Exposed: No Tendon Exposed: No Muscle Exposed: No Joint Exposed: No Tippin, Kaylin L. ([id number]) Bone Exposed: No Limited to Skin Breakdown Periwound Skin Texture Texture Color No Abnormalities Noted: No No Abnormalities Noted: No Callus: Yes Atrophie Blanche: No Crepitus: No Cyanosis: No Excoriation: No Ecchymosis: No Fluctuance: No Erythema: No Friable: No Hemosiderin Staining: No Induration: No Mottled: No Localized Edema: No Pallor: No Rash: No Rubor: No Scarring: No Temperature / Pain Moisture Temperature: No Abnormality No Abnormalities Noted: No Dry / Scaly: Yes Maceration: No Moist: No Wound Preparation Ulcer Cleansing: Rinsed/Irrigated with Saline Topical Anesthetic  Applied: Other: lidocaine 4%, Electronic Signature(s) Signed: 09/04/2014 4:34:18 PM By: Curtis Sitesorthy, Joanna Entered By: Curtis Sitesorthy, Joanna on 09/04/2014 08:43:35 Merkin, [name] ([id number]) -------------------------------------------------------------------------------- Wound Assessment Details Patient Name: Say, [name] Date of Service: 09/04/2014 8:00 AM Medical Record Number: [id number] Patient Account Number: [id number] Date of Birth/Sex: [birth year] (44 y.o. Female) Treating RN: Huel CoventryWoody, Kim Primary Care Physician: PATIENT, NO Other Clinician: Referring Physician: Treating Physician/Extender: Rudene ReBritto, Errol Weeks in Treatment: 33 Wound Status Wound Number: 6 Primary Diabetic Wound/Ulcer of the Lower Etiology: Extremity Wound Location: Right Metatarsal head first - Plantar Wound Open Status: Wounding Event: Gradually Appeared Comorbid Cataracts, Chronic Obstructive Date Acquired: 02/09/2014 History: Pulmonary Disease (COPD), Type II Weeks Of Treatment: 26 Diabetes Clustered Wound: No Photos Photo Uploaded By: Elliot GurneyWoody, RN, BSN, Kim on 09/04/2014 12:27:45 Wound Measurements Length: (cm) 0.2 Width: (cm) 0.2 Depth: (cm) 0.1 Area: (cm) 0.031 Volume: (cm) 0.003 % Reduction in Area: 98.8% % Reduction in Volume: 98.8% Epithelialization: Large (67-100%) Wound Description Classification: Grade 3 Wagner Verification: MRI Wound Margin: Flat and Intact Exudate Amount: None Present Foul Odor After Cleansing: No Wound Bed Granulation Amount: Small (1-33%) Exposed Structure Necrotic Amount: Small (1-33%) Fascia Exposed: No Necrotic Quality: Eschar Fat Layer Exposed: No Tendon Exposed: No Njie, Chelsa L. ([id number]) Muscle Exposed: No Joint Exposed: No Bone Exposed: No Limited to Skin Breakdown Periwound Skin Texture Texture Color No Abnormalities Noted: No No Abnormalities Noted: No Callus: Yes Atrophie Blanche: No Crepitus: No Cyanosis:  No Excoriation: No Ecchymosis: No Fluctuance: No Erythema: No Friable: No Hemosiderin Staining: No Induration: No Mottled: No Localized Edema: No Pallor: No Rash: No Rubor: No Scarring: No Temperature / Pain Moisture Temperature: No Abnormality No Abnormalities Noted: No Dry / Scaly: Yes Maceration: No Moist: No Wound Preparation Ulcer Cleansing: Rinsed/Irrigated with Saline Topical Anesthetic Applied: Other: lidocaine 4%, Electronic Signature(s) Signed: 09/04/2014 4:39:41 PM By: Elliot GurneyWoody, RN, BSN, Kim RN, BSN Entered By: Elliot GurneyWoody, RN, BSN, Kim on 09/04/2014 09:03:05 Wickard, [name] ([id number]) --------------------------------------------------------------------------------  Vitals Details Patient Name: Comp, Braxton L. Date of Service: 09/04/2014 8:00 AM Medical Record Number: [id number] Patient Account Number: [id number] Date of Birth/Sex: Sep 25, 1970 (44 y.o. Female) Treating RN: Huel Coventry Primary Care Physician: PATIENT, NO Other Clinician: Referring Physician: Treating Physician/Extender: Rudene Re in Treatment: 33 Vital Signs Time Taken: 08:20 Temperature (F): 97.7 Height (in): 76 Pulse (bpm): 94 Weight (lbs): 132 Respiratory Rate (breaths/min): 16 Body Mass Index (BMI): 16.1 Blood Pressure (mmHg): 128/87 Reference Range: 80 - 120 mg / dl Electronic Signature(s) Signed: 09/04/2014 4:39:41 PM By: Elliot Gurney, RN, BSN, Kim RN, BSN Entered By: Elliot Gurney, RN, BSN, Kim on 09/04/2014 08:27:38

## 2014-09-12 ENCOUNTER — Ambulatory Visit: Payer: Medicare PPO | Admitting: Surgery

## 2014-09-13 ENCOUNTER — Inpatient Hospital Stay
Admission: EM | Admit: 2014-09-13 | Discharge: 2014-09-14 | DRG: 872 | Disposition: A | Discharge status: Home / Self Care | Payer: Medicare PPO | Attending: Internal Medicine | Admitting: Internal Medicine

## 2014-09-13 ENCOUNTER — Encounter: Payer: Self-pay | Admitting: Emergency Medicine

## 2014-09-13 DIAGNOSIS — E039 Hypothyroidism, unspecified: Secondary | ICD-10-CM | POA: Diagnosis present

## 2014-09-13 DIAGNOSIS — Z883 Allergy status to other anti-infective agents status: Secondary | ICD-10-CM

## 2014-09-13 DIAGNOSIS — E860 Dehydration: Secondary | ICD-10-CM | POA: Diagnosis present

## 2014-09-13 DIAGNOSIS — I10 Essential (primary) hypertension: Secondary | ICD-10-CM | POA: Diagnosis present

## 2014-09-13 DIAGNOSIS — Z794 Long term (current) use of insulin: Secondary | ICD-10-CM

## 2014-09-13 DIAGNOSIS — N390 Urinary tract infection, site not specified: Secondary | ICD-10-CM | POA: Diagnosis present

## 2014-09-13 DIAGNOSIS — R112 Nausea with vomiting, unspecified: Secondary | ICD-10-CM | POA: Diagnosis present

## 2014-09-13 DIAGNOSIS — E876 Hypokalemia: Secondary | ICD-10-CM | POA: Diagnosis present

## 2014-09-13 DIAGNOSIS — D649 Anemia, unspecified: Secondary | ICD-10-CM | POA: Diagnosis present

## 2014-09-13 DIAGNOSIS — E871 Hypo-osmolality and hyponatremia: Secondary | ICD-10-CM | POA: Diagnosis present

## 2014-09-13 DIAGNOSIS — F329 Major depressive disorder, single episode, unspecified: Secondary | ICD-10-CM | POA: Diagnosis present

## 2014-09-13 DIAGNOSIS — Z79891 Long term (current) use of opiate analgesic: Secondary | ICD-10-CM

## 2014-09-13 DIAGNOSIS — E1165 Type 2 diabetes mellitus with hyperglycemia: Secondary | ICD-10-CM | POA: Diagnosis present

## 2014-09-13 DIAGNOSIS — Z79899 Other long term (current) drug therapy: Secondary | ICD-10-CM | POA: Diagnosis not present

## 2014-09-13 DIAGNOSIS — L89899 Pressure ulcer of other site, unspecified stage: Secondary | ICD-10-CM | POA: Diagnosis present

## 2014-09-13 DIAGNOSIS — A419 Sepsis, unspecified organism: Principal | ICD-10-CM | POA: Diagnosis present

## 2014-09-13 DIAGNOSIS — F1120 Opioid dependence, uncomplicated: Secondary | ICD-10-CM | POA: Diagnosis present

## 2014-09-13 DIAGNOSIS — R51 Headache: Secondary | ICD-10-CM | POA: Diagnosis present

## 2014-09-13 DIAGNOSIS — R1030 Lower abdominal pain, unspecified: Secondary | ICD-10-CM | POA: Diagnosis present

## 2014-09-13 DIAGNOSIS — Z9109 Other allergy status, other than to drugs and biological substances: Secondary | ICD-10-CM

## 2014-09-13 DIAGNOSIS — J449 Chronic obstructive pulmonary disease, unspecified: Secondary | ICD-10-CM | POA: Diagnosis present

## 2014-09-13 DIAGNOSIS — R739 Hyperglycemia, unspecified: Secondary | ICD-10-CM

## 2014-09-13 LAB — CBC WITH DIFFERENTIAL/PLATELET
Basophils Absolute: 0.1 10*3/uL (ref 0–0.1)
Basophils Relative: 0 %
Eosinophils Absolute: 0 10*3/uL (ref 0–0.7)
Eosinophils Relative: 0 %
HCT: 42 % (ref 35.0–47.0)
Hemoglobin: 13.2 g/dL (ref 12.0–16.0)
LYMPHS ABS: 1.5 10*3/uL (ref 1.0–3.6)
Lymphocytes Relative: 12 %
MCH: 19.9 pg — ABNORMAL LOW (ref 26.0–34.0)
MCHC: 31.3 g/dL — AB (ref 32.0–36.0)
MCV: 63.5 fL — ABNORMAL LOW (ref 80.0–100.0)
MONO ABS: 0.6 10*3/uL (ref 0.2–0.9)
Monocytes Relative: 4 %
NEUTROS ABS: 11 10*3/uL — AB (ref 1.4–6.5)
Neutrophils Relative %: 84 %
Platelets: 129 10*3/uL — ABNORMAL LOW (ref 150–440)
RBC: 6.61 MIL/uL — ABNORMAL HIGH (ref 3.80–5.20)
RDW: 17.4 % — ABNORMAL HIGH (ref 11.5–14.5)
WBC: 13.1 10*3/uL — ABNORMAL HIGH (ref 3.6–11.0)

## 2014-09-13 LAB — URINALYSIS COMPLETE WITH MICROSCOPIC (ARMC ONLY)
BILIRUBIN URINE: NEGATIVE
KETONES UR: NEGATIVE mg/dL
Nitrite: NEGATIVE
SPECIFIC GRAVITY, URINE: 1.026 (ref 1.005–1.030)
pH: 6 (ref 5.0–8.0)

## 2014-09-13 LAB — GLUCOSE, CAPILLARY
GLUCOSE-CAPILLARY: 376 mg/dL — AB (ref 65–99)
Glucose-Capillary: 429 mg/dL — ABNORMAL HIGH (ref 65–99)
Glucose-Capillary: 285 mg/dL — ABNORMAL HIGH (ref 65–99)

## 2014-09-13 LAB — COMPREHENSIVE METABOLIC PANEL
ALT: 13 U/L — AB (ref 14–54)
AST: 18 U/L (ref 15–41)
Albumin: 3.8 g/dL (ref 3.5–5.0)
Alkaline Phosphatase: 129 U/L — ABNORMAL HIGH (ref 38–126)
Anion gap: 15 (ref 5–15)
BUN: 25 mg/dL — ABNORMAL HIGH (ref 6–20)
CO2: 27 mmol/L (ref 22–32)
Calcium: 8.5 mg/dL — ABNORMAL LOW (ref 8.9–10.3)
Chloride: 82 mmol/L — ABNORMAL LOW (ref 101–111)
Creatinine, Ser: 1.09 mg/dL — ABNORMAL HIGH (ref 0.44–1.00)
GFR calc Af Amer: >60 mL/min (ref >60)
GFR calc non Af Amer: >60 mL/min (ref >60)
Glucose, Bld: 542 mg/dL (ref 65–99)
POTASSIUM: 3.6 mmol/L (ref 3.5–5.1)
Sodium: 124 mmol/L — ABNORMAL LOW (ref 135–145)
Total Bilirubin: 1 mg/dL (ref 0.3–1.2)
Total Protein: 7.3 g/dL (ref 6.5–8.1)

## 2014-09-13 LAB — LIPASE, BLOOD: LIPASE: 55 U/L — AB (ref 22–51)

## 2014-09-13 MED ORDER — INSULIN ASPART 100 UNIT/ML ~~LOC~~ SOLN: 12.0000 [IU] | Freq: Once | SUBCUTANEOUS | Status: DC

## 2014-09-13 MED ORDER — TRIAMTERENE-HCTZ 37.5-25 MG PO TABS
1.0000 | ORAL_TABLET | Freq: Every day | ORAL | Status: DC
  Administered 2014-09-13 – 2014-09-14 (×2): 1 via ORAL
  Filled 2014-09-13 (×2): qty 1

## 2014-09-13 MED ORDER — CEFTRIAXONE SODIUM IN DEXTROSE 20 MG/ML IV SOLN
INTRAVENOUS | Status: AC
  Administered 2014-09-13: 1 g via INTRAVENOUS
  Filled 2014-09-13: qty 50

## 2014-09-13 MED ORDER — ACETAMINOPHEN 650 MG RE SUPP: 650.0000 mg | Freq: Four times a day (QID) | RECTAL | Status: DC | PRN

## 2014-09-13 MED ORDER — SODIUM CHLORIDE 0.9 % IV BOLUS (SEPSIS)
1000.0000 mL | Freq: Once | INTRAVENOUS | Status: AC
  Administered 2014-09-13: 1000 mL via INTRAVENOUS

## 2014-09-13 MED ORDER — OMEPRAZOLE MAGNESIUM 20 MG PO TBEC: 20.0000 mg | DELAYED_RELEASE_TABLET | Freq: Every day | ORAL | Status: DC

## 2014-09-13 MED ORDER — INSULIN ASPART 100 UNIT/ML ~~LOC~~ SOLN
SUBCUTANEOUS | Status: AC
Start: 2014-09-13 — End: 2014-09-13
  Administered 2014-09-13: 10 [IU]
  Filled 2014-09-13: qty 10

## 2014-09-13 MED ORDER — ONDANSETRON HCL 4 MG/2ML IJ SOLN
INTRAMUSCULAR | Status: AC
  Filled 2014-09-13: qty 2

## 2014-09-13 MED ORDER — RACEPINEPHRINE HCL 2.25 % IN NEBU
INHALATION_SOLUTION | RESPIRATORY_TRACT | Status: AC
  Filled 2014-09-13: qty 0.5

## 2014-09-13 MED ORDER — ZOLPIDEM TARTRATE 5 MG PO TABS: 10.0000 mg | ORAL_TABLET | Freq: Every evening | ORAL | Status: DC | PRN

## 2014-09-13 MED ORDER — ONDANSETRON HCL 4 MG/2ML IJ SOLN
INTRAMUSCULAR | Status: AC
Start: 2014-09-13 — End: 2014-09-13
  Administered 2014-09-13: 4 mg via INTRAVENOUS
  Filled 2014-09-13: qty 2

## 2014-09-13 MED ORDER — LEVOTHYROXINE SODIUM 150 MCG PO TABS
150.0000 ug | ORAL_TABLET | Freq: Every day | ORAL | Status: DC
  Administered 2014-09-14: 150 ug via ORAL
  Filled 2014-09-13: qty 1

## 2014-09-13 MED ORDER — INSULIN ASPART 100 UNIT/ML ~~LOC~~ SOLN: 10.0000 [IU] | Freq: Once | SUBCUTANEOUS | Status: AC

## 2014-09-13 MED ORDER — INSULIN ASPART 100 UNIT/ML ~~LOC~~ SOLN
0.0000 [IU] | Freq: Three times a day (TID) | SUBCUTANEOUS | Status: DC
  Administered 2014-09-14: 5 [IU] via SUBCUTANEOUS
  Administered 2014-09-14: 3 [IU] via SUBCUTANEOUS
  Filled 2014-09-13: qty 3
  Filled 2014-09-13: qty 5
  Filled 2014-09-13: qty 3

## 2014-09-13 MED ORDER — CEFTRIAXONE SODIUM IN DEXTROSE 20 MG/ML IV SOLN
1.0000 g | Freq: Once | INTRAVENOUS | Status: AC
  Administered 2014-09-13 (×2): 1 g via INTRAVENOUS

## 2014-09-13 MED ORDER — METHADONE HCL 10 MG PO TABS
10.0000 mg | ORAL_TABLET | Freq: Every day | ORAL | Status: DC
  Administered 2014-09-13 – 2014-09-14 (×2): 10 mg via ORAL
  Filled 2014-09-13 (×2): qty 1

## 2014-09-13 MED ORDER — ONDANSETRON HCL 4 MG/2ML IJ SOLN: 4.0000 mg | Freq: Four times a day (QID) | INTRAMUSCULAR | Status: DC | PRN

## 2014-09-13 MED ORDER — HYDRALAZINE HCL 20 MG/ML IJ SOLN
10.0000 mg | INTRAMUSCULAR | Status: DC | PRN
  Administered 2014-09-13: 10 mg via INTRAVENOUS

## 2014-09-13 MED ORDER — DULOXETINE HCL 30 MG PO CPEP
60.0000 mg | ORAL_CAPSULE | Freq: Every day | ORAL | Status: DC
  Administered 2014-09-14: 60 mg via ORAL
  Filled 2014-09-13: qty 2

## 2014-09-13 MED ORDER — HYDROCODONE-ACETAMINOPHEN 5-325 MG PO TABS: 1.0000 | ORAL_TABLET | ORAL | Status: DC | PRN

## 2014-09-13 MED ORDER — METOPROLOL TARTRATE 100 MG PO TABS
100.0000 mg | ORAL_TABLET | Freq: Every day | ORAL | Status: DC
  Administered 2014-09-13 – 2014-09-14 (×2): 100 mg via ORAL
  Filled 2014-09-13 (×2): qty 1

## 2014-09-13 MED ORDER — ONDANSETRON HCL 4 MG/2ML IJ SOLN
4.0000 mg | Freq: Once | INTRAMUSCULAR | Status: AC
  Administered 2014-09-13: 4 mg via INTRAVENOUS

## 2014-09-13 MED ORDER — ONDANSETRON HCL 4 MG PO TABS
4.0000 mg | ORAL_TABLET | Freq: Four times a day (QID) | ORAL | Status: DC | PRN
  Administered 2014-09-13 – 2014-09-14 (×2): 4 mg via ORAL
  Filled 2014-09-13 (×2): qty 1

## 2014-09-13 MED ORDER — PANTOPRAZOLE SODIUM 40 MG PO TBEC
40.0000 mg | DELAYED_RELEASE_TABLET | Freq: Every day | ORAL | Status: DC
  Administered 2014-09-14: 40 mg via ORAL
  Filled 2014-09-13: qty 1

## 2014-09-13 MED ORDER — ZOLPIDEM TARTRATE 5 MG PO TABS
5.0000 mg | ORAL_TABLET | Freq: Every evening | ORAL | Status: DC | PRN
  Administered 2014-09-13: 5 mg via ORAL
  Filled 2014-09-13: qty 1

## 2014-09-13 MED ORDER — LORAZEPAM 0.5 MG PO TABS: 0.5000 mg | ORAL_TABLET | Freq: Three times a day (TID) | ORAL | Status: DC | PRN

## 2014-09-13 MED ORDER — ACETAMINOPHEN 325 MG PO TABS
650.0000 mg | ORAL_TABLET | Freq: Four times a day (QID) | ORAL | Status: DC | PRN
  Administered 2014-09-14: 650 mg via ORAL
  Filled 2014-09-13: qty 2

## 2014-09-13 MED ORDER — INSULIN ASPART 100 UNIT/ML ~~LOC~~ SOLN
SUBCUTANEOUS | Status: AC
  Filled 2014-09-13: qty 12

## 2014-09-13 MED ORDER — SODIUM CHLORIDE 0.9 % IV BOLUS (SEPSIS)
632.0000 mL | Freq: Once | INTRAVENOUS | Status: AC
  Administered 2014-09-13: 632 mL via INTRAVENOUS

## 2014-09-13 MED ORDER — INSULIN GLARGINE 100 UNIT/ML ~~LOC~~ SOLN
15.0000 [IU] | Freq: Every day | SUBCUTANEOUS | Status: DC
  Administered 2014-09-13: 15 [IU] via SUBCUTANEOUS
  Filled 2014-09-13 (×2): qty 0.15

## 2014-09-13 MED ORDER — CEFTRIAXONE SODIUM 1 G IJ SOLR: 1.0000 g | INTRAMUSCULAR | Status: DC

## 2014-09-13 MED ORDER — GABAPENTIN 100 MG PO CAPS
100.0000 mg | ORAL_CAPSULE | Freq: Two times a day (BID) | ORAL | Status: DC
  Administered 2014-09-13 – 2014-09-14 (×2): 100 mg via ORAL
  Filled 2014-09-13 (×2): qty 1

## 2014-09-13 MED ORDER — SODIUM CHLORIDE 0.9 % IV SOLN
INTRAVENOUS | Status: DC
  Administered 2014-09-13 – 2014-09-14 (×2): via INTRAVENOUS

## 2014-09-13 MED ORDER — INSULIN ASPART 100 UNIT/ML ~~LOC~~ SOLN
0.0000 [IU] | Freq: Every day | SUBCUTANEOUS | Status: DC
  Administered 2014-09-13: 5 [IU] via SUBCUTANEOUS
  Filled 2014-09-13: qty 5

## 2014-09-13 MED ORDER — HYDRALAZINE HCL 20 MG/ML IJ SOLN
INTRAMUSCULAR | Status: AC
  Administered 2014-09-13: 10 mg via INTRAVENOUS
  Filled 2014-09-13: qty 1

## 2014-09-13 MED ORDER — ALBUTEROL SULFATE (2.5 MG/3ML) 0.083% IN NEBU: 2.5000 mg | INHALATION_SOLUTION | RESPIRATORY_TRACT | Status: DC | PRN

## 2014-09-13 MED ORDER — CEFTRIAXONE SODIUM IN DEXTROSE 20 MG/ML IV SOLN
1.0000 g | INTRAVENOUS | Status: DC
  Administered 2014-09-14: 1 g via INTRAVENOUS
  Filled 2014-09-13 (×3): qty 50

## 2014-09-13 NOTE — ED Provider Notes (Signed)
[facility] Emergency Department Provider Note  ____________________________________________  Time seen: Approximately 11:49 AM  I have reviewed the triage vital signs and the nursing notes.   HISTORY  Chief Complaint Emesis    HPI [name] [name] is a 43 y.o. 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  She reports that she has had at least 7 episodes of emesis, most of which has been clear yellow however she had one episode yesterday where she vomited up "black chunky stuff". She denies hematemesis or blood in stools. She denies abdominal pain, no diarrhea no fevers or chills. She has not taken her insulin for the past 3 days because she has been feeling ill. She has not taken her antihypertensive medications either. She denies any current chest pain or difficulty breathing. No modifying factors. Current severity is moderate. She has had increased urinary frequency.   Past Medical History  Diagnosis Date  . Muscle pain   . Thyroid disease   . Diabetes   . Fatigue   . Poor circulation   . History of blood clots   . Emphysema/COPD   . Anemia   . Bruises easily   . Depression   . Frequent headaches     Patient Active Problem List   Diagnosis Date Noted  . Ulcer of other part of foot 01/10/2014    Past Surgical History  Procedure Laterality Date  . Hernia repair    . Exploratory surgery thigh    . Internal bleeding/blood clots      Current Outpatient Rx  Name  Route  Sig  Dispense  Refill  . Cyanocobalamin (VITAMIN B 12 PO)   Oral   Take by mouth.         . doxycycline (VIBRA-TABS) 100 MG tablet   Oral   Take 1 tablet (100 mg total) by mouth 2 (two) times daily.   20  tablet   0   . DULoxetine (CYMBALTA) 60 MG capsule   Oral   Take 60 mg by mouth daily.         . Ergocalciferol (VITAMIN D2 PO)   Oral   Take by mouth.         . gabapentin (NEURONTIN) 100 MG capsule   Oral   Take 100 mg by mouth.         Marland Kitchen HYDROmorphone (DILAUDID) 4 MG tablet   Oral   Take by mouth.         . hydrOXYzine (ATARAX/VISTARIL) 25 MG tablet   Oral   Take 25 mg by mouth.         . levothyroxine (SYNTHROID, LEVOTHROID) 150 MCG tablet   Oral   Take 150 mcg by mouth daily before breakfast.         . LORazepam (ATIVAN) 0.5 MG tablet   Oral   Take 0.5 mg by mouth.         . methadone (METHADOSE) 10 MG tablet   Oral   Take 10 mg by mouth.         . metoprolol (LOPRESSOR) 100 MG tablet   Oral   Take 100 mg by mouth.         Marland Kitchen omeprazole (PRILOSEC OTC) 20 MG tablet   Oral   Take 20 mg by mouth daily.         Marland Kitchen  ondansetron (ZOFRAN) 4 MG tablet   Oral   Take 4 mg by mouth.         . pioglitazone-metformin (ACTOPLUS MET) 15-850 MG per tablet   Oral   Take 1 tablet by mouth.         . promethazine (PHENERGAN) 25 MG tablet   Oral   Take 25 mg by mouth.         . silver sulfADIAZINE (SILVADENE) 1 % cream      Apply to affected area(s) once daily   400 g   1   . triamterene-hydrochlorothiazide (MAXZIDE-25) 37.5-25 MG per tablet   Oral   Take 1 tablet by mouth daily.         Marland Kitchen zolpidem (AMBIEN) 10 MG tablet   Oral   Take 10 mg by mouth at bedtime as needed for sleep.           Allergies Reglan; Ibuprofen; and Lovenox  No family history on file.  Social History History  Substance Use Topics  . Smoking status: Never Smoker   . Smokeless tobacco: Not on file  . Alcohol Use: No    Review of Systems Constitutional: No fever/chills Eyes: No visual changes. ENT: No sore throat. Cardiovascular: Denies chest pain. Respiratory: Denies shortness of breath. Gastrointestinal: No abdominal pain.  + nausea, +  vomiting.  No diarrhea.  No constipation. Genitourinary: Negative for dysuria. Musculoskeletal: Negative for back pain. Skin: Negative for rash. Neurological: Negative for headaches, focal weakness or numbness.  10-point ROS otherwise negative.  ____________________________________________   PHYSICAL EXAM:  VITAL SIGNS: ED Triage Vitals  Enc Vitals Group     BP 09/13/14 1114 134/98 mmHg     Pulse Rate 09/13/14 1114 106     Resp 09/13/14 1114 20     Temp 09/13/14 1114 98.4 F (36.9 C)     Temp Source 09/13/14 1114 Oral     SpO2 09/13/14 1114 96 %     Weight 09/13/14 1114 120 lb (54.432 kg)     Height 09/13/14 1114 _0  (1.575 m)     Head Cir --      Peak Flow --      Pain Score 09/13/14 1115 10     Pain Loc --      Pain Edu? --      Excl. in [place]? --     Constitutional: Alert and oriented. Chronically ill- appearing and in no acute distress. Eyes: Conjunctivae are normal. PERRL. EOMI. Head: Atraumatic. Nose: No congestion/rhinnorhea. Mouth/Throat: Mucous membranes are moist.  Oropharynx non-erythematous. Neck: No stridor.  Cardiovascular: Normal rate, regular rhythm. Grossly normal heart sounds.  Good peripheral circulation. Respiratory: Normal respiratory effort.  No retractions. Lungs CTAB. Gastrointestinal: Soft and nontender. No distention. No abdominal bruits. No CVA tenderness. Genitourinary: deferred Musculoskeletal: No lower extremity tenderness nor edema.  No joint effusions. Neurologic:  Normal speech and language. No gross focal neurologic deficits are appreciated. Speech is normal. No gait instability. Skin:  Skin is warm, dry and intact. No rash noted. Psychiatric: Mood and affect are normal. Speech and behavior are normal.  ____________________________________________   LABS (all labs ordered are listed, but only abnormal results are displayed)  Labs Reviewed  CBC WITH DIFFERENTIAL/PLATELET - Abnormal; Notable for the following:    WBC 13.1 (*)     RBC 6.61 (*)    MCV 63.5 (*)    MCH 19.9 (*)    MCHC 31.3 (*)    RDW 17.4 (*)  Platelets 129 (*)    Neutro Abs 11.0 (*)    All other components within normal limits  COMPREHENSIVE METABOLIC PANEL - Abnormal; Notable for the following:    Sodium 124 (*)    Chloride 82 (*)    Glucose, Bld 542 (*)    BUN 25 (*)    Creatinine, Ser 1.09 (*)    Calcium 8.5 (*)    ALT 13 (*)    Alkaline Phosphatase 129 (*)    All other components within normal limits  LIPASE, BLOOD - Abnormal; Notable for the following:    Lipase 55 (*)    All other components within normal limits  URINALYSIS COMPLETEWITH MICROSCOPIC ([facility] ONLY) - Abnormal; Notable for the following:    Color, Urine YELLOW (*)    APPearance CLOUDY (*)    Glucose, UA >500 (*)    Hgb urine dipstick 2+ (*)    Protein, ur >500 (*)    Leukocytes, UA 1+ (*)    Bacteria, UA MANY (*)    Squamous Epithelial / LPF 0-5 (*)    All other components within normal limits  CULTURE, BLOOD (ROUTINE X 2)  CULTURE, BLOOD (ROUTINE X 2)  URINE CULTURE   ____________________________________________  EKG  none ____________________________________________  RADIOLOGY  none ____________________________________________   PROCEDURES  Procedure(s) performed: None  Critical Care performed: Total critical care time spent 35 minutes.  ____________________________________________   INITIAL IMPRESSION / ASSESSMENT AND PLAN / ED COURSE  Pertinent labs & imaging results that were available during my care of the patient were reviewed by me and considered in my medical decision making (see chart for details).  [name] [name] is a 44 y.o. female with past medical history significant for type 2 diabetes, hypertension, hypothyroidism, depression, chronic methadone dependence, and suspected gastroparesis secondary to chronic narcotic use and type 2 diabetes who presents with several episodes of nonbloody nonbilious emesis, sudden onset  yesterday. On exam, she is chronically ill-appearing but in no acute distress. She has no tenderness to palpation throughout the abdomen, normal bowel sounds, she reports she continues to pass flatus and I doubt obstruction. Mildly tachycardic on arrival, will treat with antiemetics, IV fluids, insulin due to her elevated glucose level/hyperglycemia without evidence of DKA. Reassessed for disposition.  ----------------------------------------- 1:45 PM on 09/13/2014 -----------------------------------------  Labs concerning for leukocytosis with neutrophilic predominance. Urinalysis concerning for urinary tract infection. In addition to the tachycardia on arrival, the patient is also now tachypneic, and her clinical picture is concerning for sepsis. Additionally her sodium was slightly low and does not correct appropriately for degree of hyperglycemia. Will give liberal IV fluids per sepsis protocol, IV ceftriaxone. Discussed with hospitalist for admission. ____________________________________________   FINAL CLINICAL IMPRESSION(S) / ED DIAGNOSES  Final diagnoses:  UTI (lower urinary tract infection)  Sepsis, due to unspecified organism  Hyperglycemia  Non-intractable vomiting with nausea, vomiting of unspecified type      Joanne Gavel, MD 09/13/14 1345

## 2014-09-13 NOTE — H&P (Addendum)
[facility] [place] NAME: [name] [name]    MR#:  [id number]  DATE OF BIRTH:  06/01/1970  DATE OF ADMISSION:  09/13/2014  PRIMARY CARE PHYSICIAN: Margarita Rana, MD   REQUESTING/REFERRING PHYSICIAN: Dr. Edd Fabian  CHIEF COMPLAINT:   Chief Complaint  Patient presents with  . Emesis    nausea and vomiting since yesterday, states had black vomiting yesterday, diabetic, sugars 550    HISTORY OF PRESENT ILLNESS:  [name] [name]  is a 44 y.o. female with a known history of  hypertension, diabetes, COPD, chronic abdominal pain and the hypothyroidism. The patient has had multiple episodes of nausea, vomiting and lower abdominal pain since yesterday. In addition, she has a fever and chills. She has not taken her insulin for the past 3 days because she has been feeling ill. She was found to have a UTI, tachycardia and leukocytosis, and treated with the Rocephin in the ED.  PAST MEDICAL HISTORY:   Past Medical History  Diagnosis Date  . Muscle pain   . Thyroid disease   . Diabetes   . Fatigue   . Poor circulation   . History of blood clots   . Emphysema/COPD   . Anemia   . Bruises easily   . Depression   . Frequent headaches     PAST SURGICAL HISTORY:   Past Surgical History  Procedure Laterality Date  . Hernia repair    . Exploratory surgery thigh    . Internal bleeding/blood clots      SOCIAL HISTORY:   History  Substance Use Topics  . Smoking status: Never Smoker   . Smokeless tobacco: Not on file  . Alcohol Use: No    FAMILY HISTORY:  No family history on file.Father died at the 61s after surgical procedure. Mother has diabetes, hypertension and stroke.  DRUG ALLERGIES:   Allergies  Allergen Reactions  . Reglan [Metoclopramide] Shortness Of Breath  . Ibuprofen Itching and Nausea And Vomiting  . Lovenox [Enoxaparin Sodium] Other (See Comments)    Internal bleeding    REVIEW OF SYSTEMS:   CONSTITUTIONAL: Positive for fever and chills, positive for generalized weakness.  EYES: No blurred or double vision.  EARS, NOSE, AND THROAT: No tinnitus or ear pain.  RESPIRATORY: No cough, shortness of breath, wheezing or hemoptysis.  CARDIOVASCULAR: No chest pain, orthopnea, edema.  GASTROINTESTINAL: Positive for lower abdominal pain, nausea and vomiting, no diarrhea, many not or bloody stool.  GENITOURINARY: No dysuria, hematuria.  ENDOCRINE: No polyuria, nocturia,  HEMATOLOGY: No anemia, easy bruising or bleeding SKIN: No rash or lesion. MUSCULOSKELETAL: No joint pain or arthritis.   NEUROLOGIC: No tingling, numbness, weakness.  PSYCHIATRY: No anxiety or depression.   MEDICATIONS AT HOME:   Prior to Admission medications   Medication Sig Start Date End Date Taking? Authorizing Provider  Cyanocobalamin (VITAMIN B 12 PO) Take by mouth.    Historical Provider, MD  doxycycline (VIBRA-TABS) 100 MG tablet Take 1 tablet (100 mg total) by mouth 2 (two) times daily. 12/26/13   Trula Slade, DPM  DULoxetine (CYMBALTA) 60 MG capsule Take 60 mg by mouth daily.    Historical Provider, MD  Ergocalciferol (VITAMIN D2 PO) Take by mouth.    Historical Provider, MD  gabapentin (NEURONTIN) 100 MG capsule Take 100 mg by mouth.    Historical Provider, MD  HYDROmorphone (DILAUDID) 4 MG tablet Take by mouth.    Historical Provider, MD  hydrOXYzine (ATARAX/VISTARIL) 25 MG tablet Take  25 mg by mouth.    Historical Provider, MD  levothyroxine (SYNTHROID, LEVOTHROID) 150 MCG tablet Take 150 mcg by mouth daily before breakfast.    Historical Provider, MD  LORazepam (ATIVAN) 0.5 MG tablet Take 0.5 mg by mouth.    Historical Provider, MD  methadone (METHADOSE) 10 MG tablet Take 10 mg by mouth.    Historical Provider, MD  metoprolol (LOPRESSOR) 100 MG tablet Take 100 mg by mouth.    Historical Provider, MD  omeprazole (PRILOSEC OTC) 20 MG tablet Take 20 mg by mouth daily.    Historical Provider, MD   ondansetron (ZOFRAN) 4 MG tablet Take 4 mg by mouth.    Historical Provider, MD  pioglitazone-metformin (ACTOPLUS MET) 15-850 MG per tablet Take 1 tablet by mouth.    Historical Provider, MD  promethazine (PHENERGAN) 25 MG tablet Take 25 mg by mouth.    Historical Provider, MD  silver sulfADIAZINE (SILVADENE) 1 % cream Apply to affected area(s) once daily 01/02/14   Trula Slade, DPM  triamterene-hydrochlorothiazide (MAXZIDE-25) 37.5-25 MG per tablet Take 1 tablet by mouth daily.    Historical Provider, MD  zolpidem (AMBIEN) 10 MG tablet Take 10 mg by mouth at bedtime as needed for sleep.    Historical Provider, MD      VITAL SIGNS:  Blood pressure 158/108, pulse 98, temperature 98.6 F (37 C), temperature source Oral, resp. rate 28, height 5' 2"  (1.575 m), weight 54.432 kg (120 lb), SpO2 97 %.  PHYSICAL EXAMINATION:  GENERAL:  44 y.o.-year-old patient lying in the bed with no acute distress.  EYES: Pupils equal, round, reactive to light and accommodation. No scleral icterus. Extraocular muscles intact.  HEENT: Head atraumatic, normocephalic. Oropharynx and nasopharynx clear.  NECK:  Supple, no jugular venous distention. No thyroid enlargement, no tenderness.  LUNGS: Normal breath sounds bilaterally, no wheezing, rales,rhonchi or crepitation. No use of accessory muscles of respiration.  CARDIOVASCULAR: S1, S2 normal. No murmurs, rubs, or gallops.  ABDOMEN: Soft, mild tenderness in lower abdominal area, nondistended. Bowel sounds present. No organomegaly or mass.  EXTREMITIES: No pedal edema, cyanosis, or clubbing.  NEUROLOGIC: Cranial nerves II through XII are intact. Muscle strength 5/5 in all extremities. Sensation intact. Gait not checked.  PSYCHIATRIC: The patient is alert and oriented x 3.  SKIN: No obvious rash, lesion, or ulcer.   LABORATORY PANEL:   CBC  Recent Labs Lab 09/13/14 1118  WBC 13.1*  HGB 13.2  HCT 42.0  PLT 129*    ------------------------------------------------------------------------------------------------------------------  Chemistries   Recent Labs Lab 09/13/14 1118  NA 124*  K 3.6  CL 82*  CO2 27  GLUCOSE 542*  BUN 25*  CREATININE 1.09*  CALCIUM 8.5*  AST 18  ALT 13*  ALKPHOS 129*  BILITOT 1.0   ------------------------------------------------------------------------------------------------------------------  Cardiac Enzymes No results for input(s): TROPONINI in the last 168 hours. ------------------------------------------------------------------------------------------------------------------  RADIOLOGY:  No results found.  EKG:  No orders found for this or any previous visit.  IMPRESSION AND PLAN:   Sepsis with UTI Dehydration Hyponatremia Hypertension Diabetes COPD  The patient will be admitted to medical floor. I will continue Rocephin and a follow-up CBC, blood culture and urine culture. For dehydration and hyponatremia, I will start normal saline IV and follow-up BMP. For and controlled diabetes, I will start Lantus 15 units at bedtime plus sliding scale but hold by mouth diabetes medications and check a hemoglobin A1c. For hypertension, I will continue patient hypertension medication and start hydralazine IV when necessary.  All the records are reviewed and case discussed with ED provider. Management plans discussed with the patient, family and they are in agreement.  CODE STATUS: Full code   TOTAL TIME TAKING CARE OF THIS PATIENT: 53 minutes.    Demetrios Loll M.D on 09/13/2014 at 2:42 PM  Between 7am to 6pm - Pager - [phone]  After 6pm go to [email or web address] - password [email or web address] [place] [facility]  Office  [phone]  CC: Primary care physician; Margarita Rana, MD

## 2014-09-13 NOTE — Progress Notes (Signed)
ANTIBIOTIC CONSULT NOTE - INITIAL  Pharmacy Consult for Ceftriaxone   Indication: UTI  Allergies  Allergen Reactions  . Reglan [Metoclopramide] Shortness Of Breath  . Ibuprofen Itching and Nausea And Vomiting  . Lovenox [Enoxaparin Sodium] Other (See Comments)    Internal bleeding    Patient Measurements: Height: 5\' 2"  (157.5 cm) Weight: 120 lb (54.432 kg) IBW/kg (Calculated) : 50.1   Vital Signs: Temp: 99 F (37.2 C) (05/28 1949) Temp Source: Oral (05/28 1949) BP: 161/98 mmHg (05/28 1949) Pulse Rate: 102 (05/28 1949) Labs:  Recent Labs  09/13/14 1118  WBC 13.1*  HGB 13.2  PLT 129*  CREATININE 1.09*   Estimated Creatinine Clearance: 53.2 mL/min (by C-G formula based on Cr of 1.09).    Microbiology: No results found for this or any previous visit (from the past 720 hour(s)).  Medical History: Past Medical History  Diagnosis Date  . Muscle pain   . Thyroid disease   . Diabetes   . Fatigue   . Poor circulation   . History of blood clots   . Emphysema/COPD   . Anemia   . Bruises easily   . Depression   . Frequent headaches      Assessnent: Pt with nausea, vomiting and lower abdominal pain since yesterday. In addition, she has a fever and chills.  Blood and Urine cultures pending  Plan:  Ordered Ceftriaxone 1gm q24.  Pharmacy will continue to monitor  Joud Pettinato K 09/13/2014,9:07 PM

## 2014-09-13 NOTE — ED Notes (Signed)
Nausea and vomiting, black vomit yesterday, FSBS 550 this am at home

## 2014-09-14 LAB — POTASSIUM
Potassium: 2.9 mmol/L — CL (ref 3.5–5.1)
Potassium: 4 mmol/L (ref 3.5–5.1)

## 2014-09-14 LAB — GLUCOSE, CAPILLARY
GLUCOSE-CAPILLARY: 201 mg/dL — AB (ref 65–99)
GLUCOSE-CAPILLARY: 259 mg/dL — AB (ref 65–99)

## 2014-09-14 LAB — CBC
HEMATOCRIT: 40.2 % (ref 35.0–47.0)
Hemoglobin: 12.8 g/dL (ref 12.0–16.0)
MCH: 20 pg — ABNORMAL LOW (ref 26.0–34.0)
MCHC: 31.9 g/dL — ABNORMAL LOW (ref 32.0–36.0)
MCV: 62.7 fL — AB (ref 80.0–100.0)
PLATELETS: 115 10*3/uL — AB (ref 150–440)
RBC: 6.41 MIL/uL — ABNORMAL HIGH (ref 3.80–5.20)
RDW: 17.8 % — AB (ref 11.5–14.5)
WBC: 11.7 10*3/uL — ABNORMAL HIGH (ref 3.6–11.0)

## 2014-09-14 LAB — HEMOGLOBIN A1C: HEMOGLOBIN A1C: 11 % — AB (ref 4.0–6.0)

## 2014-09-14 LAB — BASIC METABOLIC PANEL
Anion gap: 8 (ref 5–15)
BUN: 14 mg/dL (ref 6–20)
CALCIUM: 8.3 mg/dL — AB (ref 8.9–10.3)
CHLORIDE: 99 mmol/L — AB (ref 101–111)
CO2: 26 mmol/L (ref 22–32)
CREATININE: 0.74 mg/dL (ref 0.44–1.00)
GFR calc non Af Amer: >60 mL/min (ref >60)
Glucose, Bld: 190 mg/dL — ABNORMAL HIGH (ref 65–99)
Potassium: 2.9 mmol/L — CL (ref 3.5–5.1)
Sodium: 133 mmol/L — ABNORMAL LOW (ref 135–145)

## 2014-09-14 LAB — MAGNESIUM: Magnesium: 1.6 mg/dL — ABNORMAL LOW (ref 1.7–2.4)

## 2014-09-14 MED ORDER — ONDANSETRON HCL 4 MG PO TABS: 4.0000 mg | ORAL_TABLET | Freq: Four times a day (QID) | ORAL | Status: DC | PRN

## 2014-09-14 MED ORDER — POTASSIUM CHLORIDE CRYS ER 20 MEQ PO TBCR
40.0000 meq | EXTENDED_RELEASE_TABLET | Freq: Once | ORAL | Status: AC
  Administered 2014-09-14: 40 meq via ORAL

## 2014-09-14 MED ORDER — POTASSIUM CHLORIDE CRYS ER 20 MEQ PO TBCR
40.0000 meq | EXTENDED_RELEASE_TABLET | Freq: Once | ORAL | Status: AC
  Administered 2014-09-14: 40 meq via ORAL
  Filled 2014-09-14: qty 2

## 2014-09-14 MED ORDER — CEFUROXIME AXETIL 250 MG PO TABS: 250.0000 mg | ORAL_TABLET | Freq: Two times a day (BID) | ORAL | Status: DC

## 2014-09-14 MED ORDER — POTASSIUM CHLORIDE IN NACL 20-0.9 MEQ/L-% IV SOLN
INTRAVENOUS | Status: DC
  Administered 2014-09-14: 08:00:00 via INTRAVENOUS
  Filled 2014-09-14 (×5): qty 1000

## 2014-09-14 NOTE — Progress Notes (Addendum)
MD notified of critical K+ of 2.9, orders received

## 2014-09-15 LAB — URINE CULTURE
Culture: >=100000
Special Requests: NORMAL

## 2014-09-16 DIAGNOSIS — E876 Hypokalemia: Secondary | ICD-10-CM | POA: Diagnosis present

## 2014-09-16 DIAGNOSIS — R112 Nausea with vomiting, unspecified: Secondary | ICD-10-CM | POA: Diagnosis present

## 2014-09-16 NOTE — Discharge Summary (Signed)
[facility] NAME: [name] [name]    MR#:  [id number]  DATE OF BIRTH:  [birth year]  DATE OF ADMISSION:  09/13/2014 ADMITTING PHYSICIAN: Demetrios Loll, MD  DATE OF DISCHARGE: 09/14/2014  2:09 PM  PRIMARY CARE PHYSICIAN: Margarita Rana, MD    ADMISSION DIAGNOSIS:  UTI (lower urinary tract infection) [N39.0] Hyperglycemia [R73.9] Sepsis, due to unspecified organism [A41.9] Non-intractable vomiting with nausea, vomiting of unspecified type [R11.2]  DISCHARGE DIAGNOSIS:  Principal Problem:   Sepsis Active Problems:   UTI (lower urinary tract infection)   Nausea with vomiting   Hypokalemia   SECONDARY DIAGNOSIS:   Past Medical History  Diagnosis Date  . Muscle pain   . Thyroid disease   . Diabetes   . Fatigue   . Poor circulation   . History of blood clots   . Emphysema/COPD   . Anemia   . Bruises easily   . Depression   . Frequent headaches      ADMITTING HISTORY [name] [name] is a 44 y.o. female with a known history of hypertension, diabetes, COPD, chronic abdominal pain and the hypothyroidism. The patient has had multiple episodes of nausea, vomiting and lower abdominal pain since yesterday. In addition, she has a fever and chills. She has not taken her insulin for the past 3 days because she has been feeling ill. She was found to have a UTI, tachycardia and leukocytosis, and treated with the Rocephin in the ED.   HOSPITAL COURSE:   Admitted to medical floor. Started on IVF, anti-emitics, IV ceftriaxone. Patient improved well. No fever. Abd pain and vomiting resolved. BS improved. Changed to PO abx and d/c home. Urine cx negative.   Stable at discharge. Tolerated her lunch normally.   CONSULTS OBTAINED:     DRUG ALLERGIES:   Allergies  Allergen Reactions  . Reglan [Metoclopramide] Shortness Of Breath  . Ibuprofen Itching and Nausea And Vomiting  . Lovenox [Enoxaparin Sodium] Other (See  Comments)    Internal bleeding    DISCHARGE MEDICATIONS:   Discharge Medication List as of 09/14/2014  1:42 PM    START taking these medications   Details  cefUROXime (CEFTIN) 250 MG tablet Take 1 tablet (250 mg total) by mouth 2 (two) times daily with a meal., Starting 09/14/2014, Until Discontinued, Normal    !! ondansetron (ZOFRAN) 4 MG tablet Take 1 tablet (4 mg total) by mouth every 6 (six) hours as needed for nausea., Starting 09/14/2014, Until Discontinued, Normal     !! - Potential duplicate medications found. Please discuss with provider.    CONTINUE these medications which have NOT CHANGED   Details  Cyanocobalamin (VITAMIN B 12 PO) Take 1,000 mcg by mouth daily. , Until Discontinued, Historical Med    DULoxetine (CYMBALTA) 60 MG capsule Take 60 mg by mouth daily., Until Discontinued, Historical Med    Ergocalciferol (VITAMIN D2 PO) Take 50,000 Int'l Units by mouth every Wednesday. , Until Discontinued, Historical Med    gabapentin (NEURONTIN) 100 MG capsule Take 100 mg by mouth 3 (three) times daily. , Until Discontinued, Historical Med    HUMULIN R 500 UNIT/ML SOLN injection Inject into the skin 2 (two) times daily. Sliding scale subcutaneous 2 times a day., Starting 06/08/2014, Until Discontinued, Historical Med    HYDROmorphone (DILAUDID) 4 MG tablet Take 4 mg by mouth every 4 (four) hours as needed for moderate pain. , Until Discontinued, Historical Med    hydrOXYzine (ATARAX/VISTARIL) 25 MG tablet  Take 25 mg by mouth every 4 (four) hours as needed for itching. , Until Discontinued, Historical Med    levothyroxine (SYNTHROID, LEVOTHROID) 150 MCG tablet Take 150 mcg by mouth daily before breakfast., Until Discontinued, Historical Med    LORazepam (ATIVAN) 0.5 MG tablet Take 1 mg by mouth at bedtime. , Until Discontinued, Historical Med    methadone (METHADOSE) 10 MG tablet Take 80 mg by mouth at bedtime. , Until Discontinued, Historical Med    metoprolol (LOPRESSOR)  100 MG tablet Take 100 mg by mouth daily. , Until Discontinued, Historical Med    omeprazole (PRILOSEC OTC) 20 MG tablet Take 20 mg by mouth daily., Until Discontinued, Historical Med    !! ondansetron (ZOFRAN) 4 MG tablet Take 4 mg by mouth every 8 (eight) hours as needed for nausea or vomiting. , Until Discontinued, Historical Med    pioglitazone-metformin (ACTOPLUS MET) 15-850 MG per tablet Take 1 tablet by mouth daily. , Until Discontinued, Historical Med    promethazine (PHENERGAN) 25 MG tablet Take 25 mg by mouth every 6 (six) hours as needed for nausea or vomiting. , Until Discontinued, Historical Med    TOUJEO SOLOSTAR 300 UNIT/ML SOPN Inject 25 Units into the skin daily., Starting 09/01/2014, Until Discontinued, Historical Med    triamterene-hydrochlorothiazide (MAXZIDE-25) 37.5-25 MG per tablet Take 1 tablet by mouth daily., Until Discontinued, Historical Med    zolpidem (AMBIEN) 10 MG tablet Take 10 mg by mouth at bedtime as needed for sleep., Until Discontinued, Historical Med     !! - Potential duplicate medications found. Please discuss with provider.        Today    VITAL SIGNS:  Blood pressure 167/98, pulse 85, temperature 98.6 F (37 C), temperature source Oral, resp. rate 18, height _0  (1.575 m), weight 54.432 kg (120 lb), SpO2 99 %.  I/O:  No intake or output data in the 24 hours ending 09/16/14 1240  PHYSICAL EXAMINATION:  Physical Exam  GENERAL:  44 y.o.-year-old patient lying in the bed with no acute distress.  LUNGS: Normal breath sounds bilaterally, no wheezing, rales,rhonchi or crepitation. No use of accessory muscles of respiration.  CARDIOVASCULAR: S1, S2 normal. No murmurs, rubs, or gallops.  ABDOMEN: Soft, non-tender, non-distended. Bowel sounds present. No organomegaly or mass.  NEUROLOGIC: Moves all 4 extremities. PSYCHIATRIC: The patient is alert and oriented x 3.  SKIN: No obvious rash, lesion, or ulcer.   DATA REVIEW:   CBC  Recent  Labs Lab 09/14/14 0354  WBC 11.7*  HGB 12.8  HCT 40.2  PLT 115*    Chemistries   Recent Labs Lab 09/13/14 1118 09/14/14 0354 09/14/14 1059  NA 124* 133*  --   K 3.6 2.9*  2.9* 4.0  CL 82* 99*  --   CO2 27 26  --   GLUCOSE 542* 190*  --   BUN 25* 14  --   CREATININE 1.09* 0.74  --   CALCIUM 8.5* 8.3*  --   MG  --  1.6*  --   AST 18  --   --   ALT 13*  --   --   ALKPHOS 129*  --   --   BILITOT 1.0  --   --     Cardiac Enzymes No results for input(s): TROPONINI in the last 168 hours.  Microbiology Results  Results for orders placed or performed during the hospital encounter of 09/13/14  Urine culture     Status: None   Collection Time:  09/13/14  2:07 PM  Result Value Ref Range Status   Specimen Description URINE, CLEAN CATCH  Final   Special Requests Normal  Final   Culture >=100,000 COLONIES/mL KLEBSIELLA PNEUMONIAE  Final   Report Status 09/15/2014 FINAL  Final   Organism ID, Bacteria KLEBSIELLA PNEUMONIAE  Final      Susceptibility   Klebsiella pneumoniae - MIC*    AMPICILLIN >=32 RESISTANT Resistant     CEFTAZIDIME <=1 SENSITIVE Sensitive     CEFAZOLIN <=4 SENSITIVE Sensitive     CEFTRIAXONE <=1 SENSITIVE Sensitive     CIPROFLOXACIN <=0.25 SENSITIVE Sensitive     GENTAMICIN <=1 SENSITIVE Sensitive     IMIPENEM <=0.25 SENSITIVE Sensitive     TRIMETH/SULFA >=320 RESISTANT Resistant     CEFOXITIN <=4 SENSITIVE Sensitive     * >=100,000 COLONIES/mL KLEBSIELLA PNEUMONIAE  Blood culture (routine x 2)     Status: None (Preliminary result)   Collection Time: 09/13/14  2:08 PM  Result Value Ref Range Status   Specimen Description BLOOD  Final   Special Requests Normal  Final   Culture NO GROWTH 3 DAYS  Final   Report Status PENDING  Incomplete  Blood culture (routine x 2)     Status: None (Preliminary result)   Collection Time: 09/13/14  5:55 PM  Result Value Ref Range Status   Specimen Description BLOOD  Final   Special Requests Normal  Final   Culture NO  GROWTH 3 DAYS  Final   Report Status PENDING  Incomplete    RADIOLOGY:  No results found.    Management plans discussed with the patient, family and they are in agreement.  CODE STATUS:   TOTAL TIME TAKING CARE OF THIS PATIENT ON DAY OF DISCHARGE: 40 minutes.    Hillary Bow R M.D on 09/16/2014 at 12:40 PM  Between 7am to 6pm - Pager - [phone]  After 6pm go to [email or web address] - password [email or web address] [facility]  [facility]  Office  [phone]  CC: Primary care physician; Margarita Rana, MD

## 2014-09-17 ENCOUNTER — Other Ambulatory Visit: Payer: Self-pay

## 2014-09-17 DIAGNOSIS — J449 Chronic obstructive pulmonary disease, unspecified: Secondary | ICD-10-CM

## 2014-09-17 MED ORDER — TIOTROPIUM BROMIDE MONOHYDRATE 18 MCG IN CAPS: 1.0000 | ORAL_CAPSULE | Freq: Every day | RESPIRATORY_TRACT | Status: AC

## 2014-09-18 LAB — CULTURE, BLOOD (ROUTINE X 2)
CULTURE: NO GROWTH
CULTURE: NO GROWTH
SPECIAL REQUESTS: NORMAL
Special Requests: NORMAL

## 2014-09-23 ENCOUNTER — Telehealth: Payer: Self-pay | Admitting: Family Medicine

## 2014-09-23 DIAGNOSIS — G8929 Other chronic pain: Secondary | ICD-10-CM

## 2014-09-23 NOTE — Telephone Encounter (Signed)
LMTCB 09/23/2014.  Thanks,   -Krissia Schreier  

## 2014-09-23 NOTE — Telephone Encounter (Signed)
Please triage

## 2014-09-23 NOTE — Telephone Encounter (Signed)
Pt contacted office for refill request on the following medications: Dilaudid 4 mg & Methadone HCI 10 mg. Pt would also like to speak with a nurse about her resent visit to the hospital. Thanks TNP

## 2014-09-24 MED ORDER — METHADONE HCL 10 MG PO TABS: 50.0000 mg | ORAL_TABLET | Freq: Every day | ORAL | Status: DC

## 2014-09-24 MED ORDER — HYDROMORPHONE HCL 4 MG PO TABS: 4.0000 mg | ORAL_TABLET | ORAL | Status: DC | PRN

## 2014-09-24 NOTE — Telephone Encounter (Signed)
Patient is calling checking on her med refills from yesterday and she also wants to schedule a Hospital follow-up. She was released from the hospital a little over a week ago, don't remember the exact date of discharge. Please advise the schedulers as to where they can schedule this follow up appt.  KB

## 2014-09-24 NOTE — Telephone Encounter (Signed)
Rx at the front desk, appointment is for 09/29/2014 12pm.  Pt advised.   Thanks,   -Vernona RiegerLaura

## 2014-09-24 NOTE — Telephone Encounter (Signed)
Rx printed. Please notify patient and try to find available slot for follow up visit.  Thanks.

## 2014-09-25 ENCOUNTER — Ambulatory Visit: Payer: Medicare PPO | Admitting: Surgery

## 2014-09-25 DIAGNOSIS — E869 Volume depletion, unspecified: Secondary | ICD-10-CM | POA: Insufficient documentation

## 2014-09-25 DIAGNOSIS — G8929 Other chronic pain: Secondary | ICD-10-CM | POA: Insufficient documentation

## 2014-09-25 DIAGNOSIS — E7800 Pure hypercholesterolemia, unspecified: Secondary | ICD-10-CM | POA: Insufficient documentation

## 2014-09-25 DIAGNOSIS — R413 Other amnesia: Secondary | ICD-10-CM | POA: Insufficient documentation

## 2014-09-25 DIAGNOSIS — G4700 Insomnia, unspecified: Secondary | ICD-10-CM | POA: Insufficient documentation

## 2014-09-25 DIAGNOSIS — IMO0002 Reserved for concepts with insufficient information to code with codable children: Secondary | ICD-10-CM | POA: Insufficient documentation

## 2014-09-25 DIAGNOSIS — R51 Headache: Secondary | ICD-10-CM

## 2014-09-25 DIAGNOSIS — Z8679 Personal history of other diseases of the circulatory system: Secondary | ICD-10-CM | POA: Insufficient documentation

## 2014-09-25 DIAGNOSIS — E1165 Type 2 diabetes mellitus with hyperglycemia: Secondary | ICD-10-CM | POA: Insufficient documentation

## 2014-09-25 DIAGNOSIS — L84 Corns and callosities: Secondary | ICD-10-CM | POA: Insufficient documentation

## 2014-09-25 DIAGNOSIS — R296 Repeated falls: Secondary | ICD-10-CM | POA: Insufficient documentation

## 2014-09-25 DIAGNOSIS — M79673 Pain in unspecified foot: Secondary | ICD-10-CM | POA: Insufficient documentation

## 2014-09-25 DIAGNOSIS — E861 Hypovolemia: Secondary | ICD-10-CM

## 2014-09-25 DIAGNOSIS — D649 Anemia, unspecified: Secondary | ICD-10-CM | POA: Insufficient documentation

## 2014-09-25 DIAGNOSIS — Z9229 Personal history of other drug therapy: Secondary | ICD-10-CM | POA: Insufficient documentation

## 2014-09-25 DIAGNOSIS — E559 Vitamin D deficiency, unspecified: Secondary | ICD-10-CM | POA: Insufficient documentation

## 2014-09-25 DIAGNOSIS — K3184 Gastroparesis: Secondary | ICD-10-CM | POA: Insufficient documentation

## 2014-09-29 ENCOUNTER — Encounter: Payer: Self-pay | Admitting: Family Medicine

## 2014-09-29 ENCOUNTER — Ambulatory Visit (INDEPENDENT_AMBULATORY_CARE_PROVIDER_SITE_OTHER): Payer: Medicare PPO | Admitting: Family Medicine

## 2014-09-29 DIAGNOSIS — J40 Bronchitis, not specified as acute or chronic: Secondary | ICD-10-CM

## 2014-09-29 DIAGNOSIS — E876 Hypokalemia: Secondary | ICD-10-CM | POA: Diagnosis not present

## 2014-09-29 DIAGNOSIS — D649 Anemia, unspecified: Secondary | ICD-10-CM | POA: Diagnosis not present

## 2014-09-29 NOTE — Progress Notes (Signed)
Subjective:     Patient ID: [name] [name], female   DOB: 05-19-1970, 44 y.o.   MRN: [id number]  HPI Patient ID: AMBAR FLEITAS, female   DOB: May 08, 1970, 44 y.o.   MRN: [id number]  Follow up Hospitalization  Patient was admitted to [facility] on 09/13/2014 and discharge on 09/14/2014. She was treated for UTI and sepsis. Lab finding also shows pt had hypokalemia (potassium was 2.9, was 4.0 before discharge). Treatment for this included Ceftin and Zofran (for N/V).  She reports excellent compliance with treatment. She reports this condition is improved.     Past Medical History  Diagnosis Date  . Muscle pain   . Thyroid disease   . Diabetes   . Fatigue   . Poor circulation   . History of blood clots   . Emphysema/COPD   . Anemia   . Bruises easily   . Depression   . Frequent headaches    Past Surgical History  Procedure Laterality Date  . Exploratory surgery thigh    . Internal bleeding/blood clots      2 Femoral and 1 IVC filters placed 2007  . Abdominal hysterectomy  1998    Bilateral oophrectomy 1999  . Hernia repair      Fermoral artery durning exploratory lap for presumed hernia in 2003, with subsequent repair, and vascularization x's 2 with muscle flap/skin graft.   . Decubitus ulcer excision  04/2007    Repair, wound re-do    reports that she has quit smoking. She has never used smokeless tobacco. She reports that she does not drink alcohol or use illicit drugs. family history includes Diabetes in her mother; Hypertension in her mother; Stroke in her mother. Allergies  Allergen Reactions  . Reglan [Metoclopramide] Shortness Of Breath  . Ibuprofen Itching and Nausea And Vomiting  . Lovenox [Enoxaparin Sodium] Other (See Comments)    Internal bleeding  . Medroxyprogesterone     Other reaction(s): Unknown  . Naproxen     Other reaction(s): Unknown      Review of Systems  Constitutional: Positive for chills, diaphoresis, activity change, fatigue and  unexpected weight change. Negative for fever and appetite change.       Objective:   Physical Exam  Constitutional: She appears lethargic. She appears cachectic. She has a sickly appearance.  Cardiovascular: Tachycardia present.   Pulmonary/Chest: No accessory muscle usage. No respiratory distress. She has no decreased breath sounds.  Neurological: She appears lethargic.   BP 110/72 mmHg  Pulse 108  Temp(Src) 98.2 F (36.8 C) (Oral)  Resp 16  Ht 5\' 4"  (1.626 m)  Wt 120 lb (54.432 kg)  BMI 20.59 kg/m2       Assessment:     See below.    Plan:     1. Hypokalemia Presumed stable.Will recheck labs today  - CBC with Differential/Platelet - COMPLETE METABOLIC PANEL WITH GFR  2. Anemia, unspecified anemia type Stable.  - CBC with Differential/Platelet  3. Bronchitis With some cough and sedation, will check CXR, encouraged patient to take deep breaths, and further plan if does not improve.   - DG Chest 2 View; Future   Lorie Phenix, MD

## 2014-09-30 ENCOUNTER — Telehealth: Payer: Self-pay | Admitting: Family Medicine

## 2014-09-30 LAB — CBC WITH DIFFERENTIAL/PLATELET
BASOS ABS: 0 10*3/uL (ref 0.0–0.2)
Basos: 0 %
EOS (ABSOLUTE): 0.1 10*3/uL (ref 0.0–0.4)
Eos: 2 %
HEMATOCRIT: 32.8 % — AB (ref 34.0–46.6)
HEMOGLOBIN: 10.1 g/dL — AB (ref 11.1–15.9)
Immature Grans (Abs): 0 10*3/uL (ref 0.0–0.1)
Immature Granulocytes: 0 %
LYMPHS ABS: 1.2 10*3/uL (ref 0.7–3.1)
Lymphs: 27 %
MCH: 20.1 pg — AB (ref 26.6–33.0)
MCHC: 30.8 g/dL — ABNORMAL LOW (ref 31.5–35.7)
MCV: 65 fL — ABNORMAL LOW (ref 79–97)
MONOS ABS: 0.3 10*3/uL (ref 0.1–0.9)
Monocytes: 7 %
NEUTROS ABS: 3 10*3/uL (ref 1.4–7.0)
NEUTROS PCT: 64 %
Platelets: 103 10*3/uL — ABNORMAL LOW (ref 150–379)
RBC: 5.02 x10E6/uL (ref 3.77–5.28)
RDW: 18.9 % — ABNORMAL HIGH (ref 12.3–15.4)
WBC: 4.6 10*3/uL (ref 3.4–10.8)

## 2014-09-30 LAB — CMP14+EGFR
ALK PHOS: 117 IU/L (ref 39–117)
ALT: 16 IU/L (ref 0–32)
AST: 17 IU/L (ref 0–40)
Albumin/Globulin Ratio: 1.7 (ref 1.1–2.5)
Albumin: 3.3 g/dL — ABNORMAL LOW (ref 3.5–5.5)
BUN/Creatinine Ratio: 14 (ref 9–23)
BUN: 16 mg/dL (ref 6–24)
Bilirubin Total: 0.3 mg/dL (ref 0.0–1.2)
CALCIUM: 8.4 mg/dL — AB (ref 8.7–10.2)
CHLORIDE: 104 mmol/L (ref 97–108)
CO2: 30 mmol/L — AB (ref 18–29)
Creatinine, Ser: 1.17 mg/dL — ABNORMAL HIGH (ref 0.57–1.00)
GFR calc non Af Amer: 58 mL/min/{1.73_m2} — ABNORMAL LOW (ref >59)
GFR, EST AFRICAN AMERICAN: 66 mL/min/{1.73_m2} (ref >59)
GLOBULIN, TOTAL: 1.9 g/dL (ref 1.5–4.5)
Glucose: 210 mg/dL — ABNORMAL HIGH (ref 65–99)
Potassium: 3.8 mmol/L (ref 3.5–5.2)
Sodium: 147 mmol/L — ABNORMAL HIGH (ref 134–144)
Total Protein: 5.2 g/dL — ABNORMAL LOW (ref 6.0–8.5)

## 2014-09-30 NOTE — Telephone Encounter (Signed)
Please notify patient of labs. Mildly anemic and slightly dehydrated with high sodium. Make sure to increase fluid intake. Also find out if went for CXR. Not in computer. Thanks.

## 2014-09-30 NOTE — Telephone Encounter (Signed)
Patient's husband advised. He reports that she will try to this afternoon for the CXR.

## 2014-10-01 ENCOUNTER — Ambulatory Visit
Admission: RE | Admit: 2014-10-01 | Discharge: 2014-10-01 | Disposition: A | Discharge status: Home / Self Care | Payer: Medicare PPO | Source: Ambulatory Visit | Attending: Family Medicine | Admitting: Family Medicine

## 2014-10-01 ENCOUNTER — Telehealth: Payer: Self-pay

## 2014-10-01 DIAGNOSIS — J40 Bronchitis, not specified as acute or chronic: Secondary | ICD-10-CM

## 2014-10-01 DIAGNOSIS — J449 Chronic obstructive pulmonary disease, unspecified: Secondary | ICD-10-CM | POA: Diagnosis not present

## 2014-10-01 DIAGNOSIS — R4182 Altered mental status, unspecified: Secondary | ICD-10-CM | POA: Insufficient documentation

## 2014-10-01 NOTE — Telephone Encounter (Signed)
-----   Message from Lorie Phenix, MD sent at 10/01/2014 12:10 PM EDT ----- No pneumonia. Please see how patient is doing. Thanks.

## 2014-10-01 NOTE — Telephone Encounter (Signed)
LMTCB Trason Shifflet Drozdowski, CMA  

## 2014-10-02 NOTE — Telephone Encounter (Signed)
Pt returning call.  MI#[phone]

## 2014-10-02 NOTE — Telephone Encounter (Signed)
Patient advised as below. Patient reports that she is feeling a little better still has some cough.

## 2014-10-10 ENCOUNTER — Other Ambulatory Visit: Payer: Self-pay | Admitting: Family Medicine

## 2014-10-10 DIAGNOSIS — G8928 Other chronic postprocedural pain: Secondary | ICD-10-CM

## 2014-10-10 DIAGNOSIS — I10 Essential (primary) hypertension: Secondary | ICD-10-CM

## 2014-10-10 DIAGNOSIS — F419 Anxiety disorder, unspecified: Secondary | ICD-10-CM

## 2014-10-13 ENCOUNTER — Emergency Department
Admission: EM | Admit: 2014-10-13 | Discharge: 2014-10-13 | Disposition: A | Discharge status: Home / Self Care | Payer: Medicare PPO | Attending: Emergency Medicine | Admitting: Emergency Medicine

## 2014-10-13 ENCOUNTER — Encounter: Payer: Self-pay | Admitting: Emergency Medicine

## 2014-10-13 ENCOUNTER — Other Ambulatory Visit: Payer: Self-pay | Admitting: Family Medicine

## 2014-10-13 ENCOUNTER — Emergency Department: Payer: Medicare PPO

## 2014-10-13 DIAGNOSIS — Y9289 Other specified places as the place of occurrence of the external cause: Secondary | ICD-10-CM | POA: Diagnosis not present

## 2014-10-13 DIAGNOSIS — W19XXXA Unspecified fall, initial encounter: Secondary | ICD-10-CM

## 2014-10-13 DIAGNOSIS — R52 Pain, unspecified: Secondary | ICD-10-CM

## 2014-10-13 DIAGNOSIS — Y9389 Activity, other specified: Secondary | ICD-10-CM | POA: Insufficient documentation

## 2014-10-13 DIAGNOSIS — S82002A Unspecified fracture of left patella, initial encounter for closed fracture: Secondary | ICD-10-CM | POA: Diagnosis not present

## 2014-10-13 DIAGNOSIS — Z79899 Other long term (current) drug therapy: Secondary | ICD-10-CM | POA: Insufficient documentation

## 2014-10-13 DIAGNOSIS — W1809XA Striking against other object with subsequent fall, initial encounter: Secondary | ICD-10-CM | POA: Diagnosis not present

## 2014-10-13 DIAGNOSIS — Z794 Long term (current) use of insulin: Secondary | ICD-10-CM | POA: Diagnosis not present

## 2014-10-13 DIAGNOSIS — Z23 Encounter for immunization: Secondary | ICD-10-CM | POA: Diagnosis not present

## 2014-10-13 DIAGNOSIS — S8992XA Unspecified injury of left lower leg, initial encounter: Secondary | ICD-10-CM | POA: Diagnosis present

## 2014-10-13 DIAGNOSIS — Y998 Other external cause status: Secondary | ICD-10-CM | POA: Diagnosis not present

## 2014-10-13 DIAGNOSIS — R0601 Orthopnea: Secondary | ICD-10-CM

## 2014-10-13 DIAGNOSIS — E119 Type 2 diabetes mellitus without complications: Secondary | ICD-10-CM | POA: Diagnosis not present

## 2014-10-13 MED ORDER — BACITRACIN 500 UNIT/GM EX OINT
1.0000 "application " | TOPICAL_OINTMENT | Freq: Two times a day (BID) | CUTANEOUS | Status: DC
  Administered 2014-10-13: 1 via TOPICAL

## 2014-10-13 MED ORDER — TETANUS-DIPHTHERIA TOXOIDS TD 5-2 LFU IM INJ
INJECTION | INTRAMUSCULAR | Status: AC
  Administered 2014-10-13: 0.5 mL via INTRAMUSCULAR
  Filled 2014-10-13: qty 0.5

## 2014-10-13 MED ORDER — TETANUS-DIPHTHERIA TOXOIDS TD 5-2 LFU IM INJ
0.5000 mL | INJECTION | Freq: Once | INTRAMUSCULAR | Status: AC
  Administered 2014-10-13: 0.5 mL via INTRAMUSCULAR

## 2014-10-13 MED ORDER — BACITRACIN ZINC 500 UNIT/GM EX OINT
TOPICAL_OINTMENT | CUTANEOUS | Status: AC
  Filled 2014-10-13: qty 0.9

## 2014-10-13 NOTE — Discharge Instructions (Signed)
Apply ice and elevate. Keep knee immobilizer in place. No weightbearing on left leg. Use walker.  Follow-up in 1-2 days with orthopedic as discussed. See above to call to schedule appointment today.  Return to the ER for new or worsening concerns.  Patellar Fracture, Adult A patellar fracture is a break in your kneecap (patella).  CAUSES   A direct blow to the knee or a fall is usually the cause of a broken patella.  A very hard and strong bending of your knee can cause a patellar fracture. RISK FACTORS Involvement in contact sports, especially sports that involve a lot of jumping. SIGNS AND SYMPTOMS   Tender and swollen knee.  Pain when you move your knee, especially when you try to straighten out your leg.  Difficulty walking or putting weight on your knee.  Misshapen knee (as if a bone is out of place). DIAGNOSIS  Patellar fracture is usually diagnosed with a physical exam and an X-ray exam. TREATMENT  Treatment depends on the type of fracture:  If your patella is still in the right position after the fracture and you can still straighten your leg out, you can usually be treated with a splint or cast for 4-6 weeks.  If your patella is broken into multiple small pieces but you are able to straighten your leg, you can usually be treated with a splint or cast for 4-6 weeks. Sometimes your patella may need to be removed before the cast is applied.  If you cannot straighten out your leg after a patellar fracture, then surgery is required to hold the bony fragments together until they heal. A cast or splint will be applied for 4-6 weeks. HOME CARE INSTRUCTIONS   Only take over-the-counter or prescription medicines for pain, discomfort, or fever as directed by your health care provider.  Use crutches as directed, and exercise the leg as directed.  Apply ice to the injured area:  Put ice in a plastic bag.  Place a towel between your skin and the bag.  Leave the ice on for 20  minutes, 2-3 times a day.  Elevate the affected knee above the level of your heart. SEEK MEDICAL CARE IF:  You suspect you have significantly injured your knee.  You hear a pop after a knee injury.  Your knee is misshapen after a knee injury.  You have pain when you move your knee.  You have difficulty walking or putting weight on your knee.  You cannot fully move your knee. SEEK IMMEDIATE MEDICAL CARE IF:  You have redness, swelling, or increasing pain in your knee.  You have a fever. Document Released: 01/01/2003 Document Revised: 01/23/2013 Document Reviewed: 11/14/2012 [facility] Patient Information 2015 [place], [place]. This information is not intended to replace advice given to you by your health care provider. Make sure you discuss any questions you have with your health care provider.

## 2014-10-13 NOTE — ED Notes (Signed)
Pt able to amb with knee immobilizer and walker with tech. Pt to take own meds at home

## 2014-10-13 NOTE — ED Notes (Signed)
Pt reports that she fell yesterday, on both of her knees. Pt was able to ambulate and bear weight at time of triage.

## 2014-10-13 NOTE — ED Provider Notes (Signed)
[facility] Emergency Department Provider Note  ____________________________________________  Time seen: Approximately 11:34 AM  I have reviewed the triage vital signs and the nursing notes.   HISTORY  Chief Complaint Knee Injury   HPI [name] [name] is a 44 y.o. female presents to the ER for the complaint of left leg pain. Patient and spouse reports that yesterday afternoon they were at the gas station and pt went into the store to get a drink, and states that she hit her toes on the concrete step up  going into the gas station falling forward. Patient states that she fell directly on bilateral knees and caught self with hands. He hit her nose on the concrete but did not hit her head. Denies loss consciousness. Denies headache, nausea, vomiting or vision changes.  Reports having pain walking due to left knee pain. Patient states that she also has having pain in lower thigh and shin since fall. Patient reports long history of falls.. She reports that she also landed on her right knee and has some scratches there but is not having any pain. Denies pain to hands. Denies other pain or injury at this time.  Denies recent weakness, chest pain, shortness of breath, abdominal pain. Denies neck or back pain.REports continues to ambulate since fall but with pain to left knee.    Past Medical History  Diagnosis Date  . Muscle pain   . Thyroid disease   . Diabetes   . Fatigue   . Poor circulation   . History of blood clots   . Emphysema/COPD   . Anemia   . Bruises easily   . Depression   . Frequent headaches     Patient Active Problem List   Diagnosis Date Noted  . Absolute anemia 09/25/2014  . History of anticoagulant therapy 09/25/2014  . Chronic headache 09/25/2014  . Excessive falling 09/25/2014  . Callus of foot 09/25/2014  . Foot pain 09/25/2014  . 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 H/O cardiovascular disorder 09/25/2014  . Hypercholesteremia  09/25/2014  . Cannot sleep 09/25/2014  . Amnesia 09/25/2014  . Type II diabetes mellitus, uncontrolled 09/25/2014  . Avitaminosis D 09/25/2014  . Depletion of volume of extracellular fluid 09/25/2014  . Nausea with vomiting 09/16/2014  . Hypokalemia 09/16/2014  . Sepsis 09/13/2014  . UTI (lower urinary tract infection) 09/13/2014  . Ulcer of other part of foot 01/10/2014  . Clinical depression 04/10/2009  . Hypoxemia 07/09/2007  . Feeling bilious 12/28/2006  . Abnormal LFTs 03/27/2006  . Enlarged liver 03/27/2006  . Current tobacco use 03/27/2006  . Anxiety 12/08/2005  . Awareness of heartbeats 12/08/2005  . Breathlessness lying flat 06/08/2005  . Chronic pain following surgery or procedure 03/30/2005  . Acid reflux 03/30/2005  . Essential (primary) hypertension 03/30/2005  . Adult hypothyroidism 03/30/2005  . Menopausal and postmenopausal disorder 03/30/2005  . Phlebitis or thrombophlebitis during or resulting from a procedure 03/17/2005  . Infected postoperative seroma 04/18/1898   Patient follows with [facility] for bilateral feet wounds. Patient and spouse reports that this is were calluses keep reoccurring. Patient with dressings present Past Surgical History  Procedure Laterality Date  . Exploratory surgery thigh    . Internal bleeding/blood clots      2 Femoral and 1 IVC filters placed 2007  . Abdominal hysterectomy  1998    Bilateral oophrectomy 1999  . Hernia repair      Fermoral artery durning exploratory lap for presumed hernia in 2003, with  subsequent repair, and vascularization x's 2 with muscle flap/skin graft.   . Decubitus ulcer excision  04/2007    Repair, wound re-do    Current Outpatient Rx  Name  Route  Sig  Dispense  Refill  . atorvastatin (LIPITOR) 10 MG tablet   Oral   Take by mouth.         . Cyanocobalamin (VITAMIN B 12 PO)   Oral   Take 1,000 mcg by mouth daily.          . DULoxetine (CYMBALTA) 60 MG capsule   Oral   Take 60  mg by mouth daily.         . Ergocalciferol (VITAMIN D2 PO)   Oral   Take 50,000 Int'l Units by mouth every Wednesday.          . eszopiclone (LUNESTA) 2 MG TABS tablet   Oral   Take by mouth.         . gabapentin (NEURONTIN) 100 MG capsule      take 1 capsule by mouth three times a day   90 capsule   1     DX: 997.2   . HUMULIN R 500 UNIT/ML SOLN injection   Subcutaneous   Inject into the skin 2 (two) times daily. Sliding scale subcutaneous 2 times a day.      0     Dispense as written.   Marland Kitchen. HYDROmorphone (DILAUDID) 4 MG tablet   Oral   Take 1 tablet (4 mg total) by mouth every 4 (four) hours as needed for moderate pain.   120 tablet   0   . HYDROmorphone (DILAUDID) 4 MG tablet   Oral   Take by mouth.         . hydrOXYzine (ATARAX/VISTARIL) 25 MG tablet   Oral   Take by mouth.         . insulin NPH-regular Human (NOVOLIN 70/30) (70-30) 100 UNIT/ML injection   Subcutaneous   Inject into the skin.         Marland Kitchen. ketoconazole (NIZORAL) 2 % cream      KETOCONAZOLE, 2% (External Cream)  1 Cream apply to affected area qd for 0 days  Quantity: 30;  Refills: 5   Ordered :23-May-2014  Lorie PhenixMaloney, Nancy MD;  Started 23-May-2014 Active Comments: DX: 110.5         . levothyroxine (SYNTHROID, LEVOTHROID) 200 MCG tablet   Oral   Take by mouth.         Marland Kitchen. LORazepam (ATIVAN) 0.5 MG tablet      take 1 tablet by mouth 1-2 TIMES A DAY   60 tablet   1   . methadone (DOLOPHINE) 10 MG tablet   Oral   Take by mouth.         . metoprolol succinate (TOPROL-XL) 50 MG 24 hr tablet   Oral   Take by mouth.         . nortriptyline (PAMELOR) 50 MG capsule   Oral   Take by mouth.         Marland Kitchen. omeprazole (PRILOSEC OTC) 20 MG tablet   Oral   Take 20 mg by mouth daily.         Marland Kitchen. omeprazole (PRILOSEC) 40 MG capsule   Oral   Take by mouth.         . ondansetron (ZOFRAN ODT) 4 MG disintegrating tablet   Oral   Take by mouth.         . promethazine  (  PHENERGAN) 25 MG tablet   Oral   Take by mouth.         . tiotropium (SPIRIVA HANDIHALER) 18 MCG inhalation capsule   Inhalation   Place 1 capsule (18 mcg total) into inhaler and inhale daily.   30 capsule   11   . TOUJEO SOLOSTAR 300 UNIT/ML SOPN   Subcutaneous   Inject 25 Units into the skin daily.      0     Dispense as written.   . triamterene-hydrochlorothiazide (MAXZIDE-25) 37.5-25 MG per tablet      take 1 tablet by mouth once daily   30 tablet   1     DX: 401.9   . zolpidem (AMBIEN) 10 MG tablet   Oral   Take 10 mg by mouth at bedtime as needed for sleep.         Dilaudid tabs MEthadone   Allergies Reglan; Ibuprofen; Lovenox; Medroxyprogesterone; and Naproxen  Family History  Problem Relation Age of Onset  . Hypertension Mother   . Diabetes Mother   . Stroke Mother     Social History History  Substance Use Topics  . Smoking status: Former Games developer  . Smokeless tobacco: Never Used  . Alcohol Use: No    Review of Systems Constitutional: No fever/chills Eyes: No visual changes. ENT: No sore throat. Cardiovascular: Denies chest pain. Respiratory: Denies shortness of breath. Gastrointestinal: No abdominal pain.  No nausea, no vomiting.  No diarrhea.  No constipation. Genitourinary: Negative for dysuria. Musculoskeletal: Negative for back pain.left knee pain Skin: Negative for rash. Neurological: Negative for headaches, focal weakness or numbness.  10-point ROS otherwise negative.  ____________________________________________   PHYSICAL EXAM:  VITAL SIGNS: ED Triage Vitals  Enc Vitals Group     BP 10/13/14 1101 146/105 mmHg     Pulse Rate 10/13/14 1101 110     Resp -- 18     Temp 10/13/14 1101 98.1 F (36.7 C)     Temp Source 10/13/14 1101 Oral     SpO2 10/13/14 1101 97 %     Weight --      Height --      Head Cir --      Peak Flow --      Pain Score 10/13/14 1035 6     Pain Loc --      Pain Edu? --      Excl. in GC? --   Pt  reports has not yet taken daily meds today and planning to once home.  Blood pressure 153/103, pulse 100, temperature 98.3 F (36.8 C), temperature source Oral, resp. rate 18, SpO2 100 %.  Constitutional: Alert and oriented. Well appearing and in no acute distress. Eyes: Conjunctivae are normal. PERRL. EOMI. Head: Atraumatic. Nose: No congestion/rhinnorhea. Nontender. No swelling. Mouth/Throat: Mucous membranes are moist.  Oropharynx non-erythematous. Neck: No stridor.  No cervical spine tenderness to palpation Hematological/Lymphatic/Immunilogical: No cervical lymphadenopathy. Cardiovascular: Normal rate, regular rhythm. Grossly normal heart sounds.  Good peripheral circulation. Respiratory: Normal respiratory effort.  No retractions. Lungs CTAB. Gastrointestinal: Soft and nontender. No distention. No abdominal bruits. No CVA tenderness. Musculoskeletal: No lower extremity tenderness nor edema.  No joint effusions. Dressings present to bilateral feet, per patient place by [facility]. Bilateral pedal pulses dorsalis pedia and posterior tibialis equal and easily found. No cervical, thoracic or lumbar tenderness. Changes positions without difficulty.  Except: Left mid anterior shin mild TTP, left distal femur mild TTP, no ecchymosis or swelling. No calf tenderness.  Left anterior knee surrounding patella mod TTP with mod swelling and ecchymosis. Very superficial abrasion present. Pain with flexion, but full ROM present.  Right knee with anterior patella superficial abrasions, nontender, full ROM. No swelling.  Right hand minimal ecchymosis. Full ROM and nontender.  Neurologic:  Normal speech and language. No gross focal neurologic deficits are appreciated. Speech is normal.  Skin:  Skin is warm, dry and intact. No rash noted. Psychiatric: Mood and affect are normal. Speech and behavior are  normal.  ____________________________________________  ____________________________________________  RADIOLOGY  LEFT TIBIA AND FIBULA - 2 VIEW  COMPARISON: None.  FINDINGS: Two views of the left tibia and fibula show no evidence for an acute fracture in either bone. There is diffuse demineralization. Overlying soft tissues are unremarkable.  On the lateral film, there appears to be a transverse fracture of the patella, but the knee is not well evaluated on this study.  IMPRESSION: Probable patellar fracture. Dedicated knee films recommended to further evaluate.   Electronically Signed By: Kennith Center M.D. On: 10/13/2014 12:19 __LEFT FEMUR 2 VIEWS  COMPARISON: None.  FINDINGS: Left femur is well visualized and demonstrates no evidence of acute fracture or dislocation. There is a mid patellar fracture with mild distraction of the fracture fragments. Soft tissue edema is noted as well as a small joint effusion. Diffuse left iliac arterial stenting is noted.  IMPRESSION: Patellar fracture. No other bony abnormality is seen.   Electronically Signed By: Alcide Clever M.D. On: 10/13/2014 12:38__________________________________________  Noralee Stain, personally viewed and evaluated these images as part of my medical decision making.   PROCEDURES  Procedure(s) performed SPLINT APPLICATION Date/Time: 4:16 PM Authorized by: Renford Dills Consent: Verbal consent obtained. Risks and benefits: risks, benefits and alternatives were discussed Consent given by: patient Splint applied by: ed technician Location details: left knee soft knee immobilizer And walker Post-procedure: The splinted body part was neurovascularly unchanged following the procedure. Patient tolerance: Patient tolerated the procedure well with no immediate complications.  Discussed pt and plan of care with Dr Shaune Pollack who agreed with plan.      ____________________________________________   INITIAL IMPRESSION / ASSESSMENT AND PLAN / ED COURSE  Pertinent labs & imaging results that were available during my care of the patient were reviewed by me and considered in my medical decision making (see chart for details).  Well-appearing patient. Presents the ER with spouse at bedside for the complaint of left knee pain post mechanical fall yesterday. Patient reports that she was walking into a gas station tripped falling forward on bilateral knees. Denies other pain. Denies head injury or loss of consciousness.  Left knee positive for patella fracture. Will place patient in knee immobilizer as well as discharge with walker. AMbulatory in room and hallway with steady gait with walker. Discussed strict follow-up and return parameters. Apply ice and elevate. Patient has home pain medication, Discussed taking as previously directed by her primary care physician. Follow-up with orthopedic in 1-2 days. Patient spouse agree to plan. ____________________________________________   FINAL CLINICAL IMPRESSION(S) / ED DIAGNOSES  Final diagnoses:  Pain  Patella fracture, left, closed, initial encounter  Fall, initial encounter      Renford Dills, NP 10/13/14 1618  Governor Rooks, MD 10/16/14 1022

## 2014-10-17 ENCOUNTER — Other Ambulatory Visit: Payer: Self-pay | Admitting: Family Medicine

## 2014-10-17 DIAGNOSIS — G8929 Other chronic pain: Secondary | ICD-10-CM

## 2014-10-17 DIAGNOSIS — G8928 Other chronic postprocedural pain: Secondary | ICD-10-CM

## 2014-10-17 DIAGNOSIS — N390 Urinary tract infection, site not specified: Secondary | ICD-10-CM

## 2014-10-17 MED ORDER — CIPROFLOXACIN HCL 500 MG PO TABS: 500.0000 mg | ORAL_TABLET | Freq: Two times a day (BID) | ORAL | Status: DC

## 2014-10-17 NOTE — Telephone Encounter (Signed)
Pt states she feels like she has a UTI.  Pt is having frequency and burning when she voids.  Pt is requesting a Rx to help with this.  [address] [place] [place]  [phone]

## 2014-10-17 NOTE — Telephone Encounter (Signed)
Sent in antibiotic. OV if not improved. Thanks.

## 2014-10-21 NOTE — Telephone Encounter (Signed)
Left message advising pt below.   Thanks,   -Vernona RiegerLaura

## 2014-10-23 MED ORDER — METHADONE HCL 10 MG PO TABS: 10.0000 mg | ORAL_TABLET | Freq: Every day | ORAL | Status: DC

## 2014-10-23 MED ORDER — HYDROMORPHONE HCL 4 MG PO TABS: 4.0000 mg | ORAL_TABLET | ORAL | Status: DC | PRN

## 2014-10-23 NOTE — Telephone Encounter (Signed)
Pt contacted office for refill request on the following medications: HYDROmorphone (DILAUDID) 4 MG tablet & methadone (DOLOPHINE) 10 MG tablet  Pt stated she needs this today because she is out and that she called last week to request the refill. There was a telephone message from 10/17/14 but nothing was filled out  in the message. Thanks TNP

## 2014-10-23 NOTE — Telephone Encounter (Signed)
Last refill Hydromorphone was 09/24/2014. Last refill Methadone was 08/19/2014. Allene DillonEmily Drozdowski, CMA

## 2014-10-23 NOTE — Telephone Encounter (Signed)
Prescription printed. Please notify patient it is ready for pick up. Also needs  Ov.  Thanks- Dr. Elease HashimotoMaloney.

## 2014-10-24 NOTE — Telephone Encounter (Signed)
LMTCB 10/24/2014  Thanks,   -Leeasia Secrist  

## 2014-11-07 ENCOUNTER — Telehealth: Payer: Self-pay | Admitting: Family Medicine

## 2014-11-07 ENCOUNTER — Other Ambulatory Visit: Payer: Self-pay | Admitting: Family Medicine

## 2014-11-07 DIAGNOSIS — G4700 Insomnia, unspecified: Secondary | ICD-10-CM

## 2014-11-07 NOTE — Telephone Encounter (Signed)
Ok to call in rx.  Thanks.  

## 2014-11-07 NOTE — Telephone Encounter (Signed)
LMTCB-- med called into pharmacy

## 2014-11-10 ENCOUNTER — Encounter: Payer: Self-pay | Admitting: Family Medicine

## 2014-11-10 ENCOUNTER — Ambulatory Visit (INDEPENDENT_AMBULATORY_CARE_PROVIDER_SITE_OTHER): Payer: Medicare PPO | Admitting: Family Medicine

## 2014-11-10 VITALS — BP 92/60 | HR 76 | Temp 97.4°F | Resp 16

## 2014-11-10 DIAGNOSIS — I10 Essential (primary) hypertension: Secondary | ICD-10-CM

## 2014-11-10 DIAGNOSIS — N3080 Other cystitis without hematuria: Secondary | ICD-10-CM

## 2014-11-10 DIAGNOSIS — G8929 Other chronic pain: Secondary | ICD-10-CM

## 2014-11-10 DIAGNOSIS — G8928 Other chronic postprocedural pain: Secondary | ICD-10-CM

## 2014-11-10 DIAGNOSIS — E559 Vitamin D deficiency, unspecified: Secondary | ICD-10-CM

## 2014-11-10 DIAGNOSIS — S82002P Unspecified fracture of left patella, subsequent encounter for closed fracture with malunion: Secondary | ICD-10-CM

## 2014-11-10 DIAGNOSIS — N3090 Cystitis, unspecified without hematuria: Secondary | ICD-10-CM | POA: Insufficient documentation

## 2014-11-10 MED ORDER — HYDROMORPHONE HCL 4 MG PO TABS: 4.0000 mg | ORAL_TABLET | ORAL | Status: DC | PRN

## 2014-11-10 MED ORDER — TERCONAZOLE 0.4 % VA CREA: 1.0000 | TOPICAL_CREAM | Freq: Every day | VAGINAL | Status: DC

## 2014-11-10 MED ORDER — METHADONE HCL 10 MG PO TABS: 10.0000 mg | ORAL_TABLET | Freq: Every day | ORAL | Status: DC

## 2014-11-10 MED ORDER — CEFUROXIME AXETIL 250 MG PO TABS: 250.0000 mg | ORAL_TABLET | Freq: Two times a day (BID) | ORAL | Status: AC

## 2014-11-10 NOTE — Progress Notes (Signed)
Patient ID: [name] [name], female   DOB: 1970-11-18, 44 y.o.   MRN: [id number]         Patient: [name] [name] Female    DOB: 04-04-1971   44 y.o.   MRN: [id number] Visit Date: 11/11/2014  Today's Provider: Lorie Phenix, MD   No chief complaint on file.  Subjective:    Urinary Tract Infection  This is a chronic problem. The current episode started more than 1 month ago. The problem occurs every urination. The problem has been gradually worsening. The quality of the pain is described as burning. There has been no fever. Associated symptoms include frequency and urgency. Pertinent negatives include no chills, discharge, flank pain, hematuria, hesitancy, nausea, possible pregnancy, sweats or vomiting. She has tried antibiotics for the symptoms. The treatment provided no relief.  Fall The accident occurred more than 1 week ago (Happened about two weeks ago.  ). The fall occurred while walking (Trip and fell in her bed room; patient reports trying to walk with her fracture patella. ). She landed on carpet. The volume of blood lost was minimal. The point of impact was the head. The pain is present in the head. The pain is at a severity of 6/10. Associated symptoms include abdominal pain and headaches. Pertinent negatives include no fever, hematuria, nausea, numbness or vomiting.  Knee Pain  The incident occurred more than 1 week ago (About a month ago. ). The incident occurred in the street. The injury mechanism was a fall. The pain is present in the left knee. The quality of the pain is described as aching. The pain is at a severity of 9/10. The pain is severe. The pain has been fluctuating since onset. Pertinent negatives include no numbness.      Follow up Hospitalization  Patient was admitted to [facility] on 10/13/2014 and discharged on 10/13/2014. She was treated for Left knee fracture. Treatment for this included immobilized . She reports good compliance with treatment. She  reports this condition is Unchanged.  Needed referral to Orthopedics.  Does not know who it was. Needs referral to unknown doctor.   ------------------------------------------------------------------------------------      Allergies  Allergen Reactions  . Reglan [Metoclopramide] Shortness Of Breath  . Ibuprofen Itching and Nausea And Vomiting  . Lovenox [Enoxaparin Sodium] Other (See Comments)    Internal bleeding  . Medroxyprogesterone     Other reaction(s): Unknown  . Naproxen     Other reaction(s): Unknown   Previous Medications   ATORVASTATIN (LIPITOR) 10 MG TABLET    Take by mouth.   CYANOCOBALAMIN (VITAMIN B 12 PO)    Take 1,000 mcg by mouth daily.    DULERA 100-5 MCG/ACT AERO    inhale 2 puffs by mouth every 12 hours   DULOXETINE (CYMBALTA) 60 MG CAPSULE    Take 60 mg by mouth daily.   ERGOCALCIFEROL (VITAMIN D2 PO)    Take 50,000 Int'l Units by mouth every Wednesday.    ESZOPICLONE (LUNESTA) 2 MG TABS TABLET    take 1 tablet by mouth at bedtime   GABAPENTIN (NEURONTIN) 100 MG CAPSULE    take 1 capsule by mouth three times a day   HUMULIN R 500 UNIT/ML SOLN INJECTION    Inject into the skin 2 (two) times daily. Sliding scale subcutaneous 2 times a day.   HYDROXYZINE (ATARAX/VISTARIL) 25 MG TABLET    Take by mouth.   INSULIN NPH-REGULAR HUMAN (NOVOLIN 70/30) (70-30) 100 UNIT/ML INJECTION    Inject into the  skin.   KETOCONAZOLE (NIZORAL) 2 % CREAM    KETOCONAZOLE, 2% (External Cream)  1 Cream apply to affected area qd for 0 days  Quantity: 30;  Refills: 5   Ordered :23-May-2014  Lorie Phenix MD;  Started 23-May-2014 Active Comments: DX: 110.5   LEVOTHYROXINE (SYNTHROID, LEVOTHROID) 200 MCG TABLET    Take by mouth.   LORAZEPAM (ATIVAN) 0.5 MG TABLET    take 1 tablet by mouth 1-2 TIMES A DAY   METOPROLOL SUCCINATE (TOPROL-XL) 50 MG 24 HR TABLET    Take by mouth.   NORTRIPTYLINE (PAMELOR) 50 MG CAPSULE    Take by mouth.   OMEPRAZOLE (PRILOSEC OTC) 20 MG TABLET    Take 20 mg  by mouth daily.   OMEPRAZOLE (PRILOSEC) 40 MG CAPSULE    Take by mouth.   ONDANSETRON (ZOFRAN ODT) 4 MG DISINTEGRATING TABLET    Take by mouth.   PROMETHAZINE (PHENERGAN) 25 MG TABLET    Take by mouth.   TIOTROPIUM (SPIRIVA HANDIHALER) 18 MCG INHALATION CAPSULE    Place 1 capsule (18 mcg total) into inhaler and inhale daily.   TOUJEO SOLOSTAR 300 UNIT/ML SOPN    Inject 25 Units into the skin daily.   TRIAMTERENE-HYDROCHLOROTHIAZIDE (MAXZIDE-25) 37.5-25 MG PER TABLET    take 1 tablet by mouth once daily   ZOLPIDEM (AMBIEN) 10 MG TABLET    Take 10 mg by mouth at bedtime as needed for sleep.    Review of Systems  Constitutional: Positive for fatigue. Negative for fever, chills, diaphoresis, activity change, appetite change and unexpected weight change.  Gastrointestinal: Positive for abdominal pain. Negative for nausea, vomiting, diarrhea, constipation, blood in stool, abdominal distention, anal bleeding and rectal pain.  Genitourinary: Positive for dysuria, urgency and frequency. Negative for hesitancy, hematuria, flank pain, decreased urine volume, vaginal bleeding, vaginal discharge, enuresis, difficulty urinating, genital sores, vaginal pain, menstrual problem, pelvic pain and dyspareunia.  Musculoskeletal: Positive for back pain and gait problem.  Neurological: Positive for dizziness and headaches. Negative for tremors, seizures, syncope, facial asymmetry, speech difficulty, weakness, light-headedness and numbness.    History  Substance Use Topics  . Smoking status: Former Games developer  . Smokeless tobacco: Never Used  . Alcohol Use: No   Objective:   BP 92/60 mmHg  Pulse 76  Temp(Src) 97.4 F (36.3 C) (Oral)  Resp 16  Physical Exam  Constitutional: She is oriented to person, place, and time. She appears well-developed. She appears cachectic.  HENT:  Well healing laceration on back of head.   Cardiovascular: Normal rate and regular rhythm.   Pulmonary/Chest: No accessory muscle usage.  No respiratory distress. She has no decreased breath sounds.  Musculoskeletal:  In wheelchair today, brace on left leg.   Neurological: She is alert and oriented to person, place, and time.  Psychiatric: She has a normal mood and affect.      Assessment & Plan:     1. Chronic pain following surgery or procedure Refilled medication. Will not decrease today in light of fracture.   - methadone (DOLOPHINE) 10 MG tablet; Take 1 tablet (10 mg total) by mouth 5 (five) times daily.  Dispense: 155 tablet; Refill: 0  - HYDROmorphone (DILAUDID) 4 MG tablet; Take 1 tablet (4 mg total) by mouth every 4 (four) hours as needed for moderate pain.  Dispense: 120 tablet; Refill: 0  2. Avitaminosis D Needs repeat labs at future visit.   3. Essential (primary) hypertension Stable today.  4. Recurrent cystitis Does not feel like infection  ever cleared up. Was hospitalized last month for sepsis. Will send for culture, retreat and refer to urology.  - Ambulatory referral to Urology - CULTURE, URINE COMPREHENSIVE - terconazole (TERAZOL 7) 0.4 % vaginal cream; Place 1 applicator vaginally at bedtime.  Dispense: 45 g; Refill: 0 - cefUROXime (CEFTIN) 250 MG tablet; Take 1 tablet (250 mg total) by mouth 2 (two) times daily with a meal.  Dispense: 14 tablet; Refill: 0  6. Patella fracture, left, closed, with malunion, subsequent encounter Will refer to orthopedics.  - AMB referral to orthopedics     Lorie Phenix, MD  [facility]

## 2014-11-12 ENCOUNTER — Telehealth: Payer: Self-pay

## 2014-11-12 LAB — CULTURE, URINE COMPREHENSIVE

## 2014-11-12 NOTE — Telephone Encounter (Signed)
-----   Message from Lorie Phenix, MD sent at 11/12/2014  9:53 AM EDT ----- No infection. Please see how patient is doing.  Recommend still proceed with urology appointment. Thanks.

## 2014-11-12 NOTE — Telephone Encounter (Signed)
Pt advised as directed below.  She reports feeling better since starting the antibiotic.    Thanks,   -Vernona Rieger

## 2014-11-18 ENCOUNTER — Other Ambulatory Visit: Payer: Self-pay

## 2014-11-18 DIAGNOSIS — N3090 Cystitis, unspecified without hematuria: Secondary | ICD-10-CM

## 2014-11-18 MED ORDER — TERCONAZOLE 0.4 % VA CREA: 1.0000 | TOPICAL_CREAM | Freq: Every day | VAGINAL | Status: AC

## 2014-11-20 ENCOUNTER — Telehealth: Payer: Self-pay | Admitting: Family Medicine

## 2014-11-20 DIAGNOSIS — G8928 Other chronic postprocedural pain: Secondary | ICD-10-CM

## 2014-11-20 DIAGNOSIS — G8929 Other chronic pain: Secondary | ICD-10-CM

## 2014-11-20 MED ORDER — HYDROMORPHONE HCL 4 MG PO TABS: 4.0000 mg | ORAL_TABLET | ORAL | Status: AC | PRN

## 2014-11-20 MED ORDER — METHADONE HCL 10 MG PO TABS: 10.0000 mg | ORAL_TABLET | Freq: Every day | ORAL | Status: AC

## 2014-11-20 NOTE — Telephone Encounter (Signed)
Pt called saying her husband lost her prescriptions.  For her pain medication.  Please call her.  Thanks Barth Kirks

## 2014-11-20 NOTE — Telephone Encounter (Signed)
Ok to print new rx.  Thanks.

## 2014-11-20 NOTE — Telephone Encounter (Signed)
Spoke with pt, and advised of the note below.

## 2014-11-20 NOTE — Telephone Encounter (Signed)
Left message to call back  

## 2014-12-02 ENCOUNTER — Inpatient Hospital Stay
Admission: EM | Admit: 2014-12-02 | Discharge: 2014-12-18 | DRG: 871 | Disposition: E | Payer: Medicare PPO | Attending: Internal Medicine | Admitting: Internal Medicine

## 2014-12-02 ENCOUNTER — Encounter: Payer: Self-pay | Admitting: *Deleted

## 2014-12-02 ENCOUNTER — Emergency Department: Payer: Medicare PPO

## 2014-12-02 ENCOUNTER — Inpatient Hospital Stay: Payer: Medicare PPO

## 2014-12-02 DIAGNOSIS — G9340 Encephalopathy, unspecified: Secondary | ICD-10-CM

## 2014-12-02 DIAGNOSIS — I2510 Atherosclerotic heart disease of native coronary artery without angina pectoris: Secondary | ICD-10-CM | POA: Diagnosis present

## 2014-12-02 DIAGNOSIS — E039 Hypothyroidism, unspecified: Secondary | ICD-10-CM | POA: Diagnosis present

## 2014-12-02 DIAGNOSIS — D65 Disseminated intravascular coagulation [defibrination syndrome]: Secondary | ICD-10-CM | POA: Diagnosis present

## 2014-12-02 DIAGNOSIS — Z66 Do not resuscitate: Secondary | ICD-10-CM | POA: Diagnosis present

## 2014-12-02 DIAGNOSIS — R6521 Severe sepsis with septic shock: Secondary | ICD-10-CM | POA: Diagnosis present

## 2014-12-02 DIAGNOSIS — Z823 Family history of stroke: Secondary | ICD-10-CM | POA: Diagnosis not present

## 2014-12-02 DIAGNOSIS — R34 Anuria and oliguria: Secondary | ICD-10-CM | POA: Diagnosis present

## 2014-12-02 DIAGNOSIS — Z90710 Acquired absence of both cervix and uterus: Secondary | ICD-10-CM | POA: Diagnosis not present

## 2014-12-02 DIAGNOSIS — E1165 Type 2 diabetes mellitus with hyperglycemia: Secondary | ICD-10-CM | POA: Diagnosis present

## 2014-12-02 DIAGNOSIS — L84 Corns and callosities: Secondary | ICD-10-CM | POA: Diagnosis present

## 2014-12-02 DIAGNOSIS — L97509 Non-pressure chronic ulcer of other part of unspecified foot with unspecified severity: Secondary | ICD-10-CM | POA: Diagnosis present

## 2014-12-02 DIAGNOSIS — R7989 Other specified abnormal findings of blood chemistry: Secondary | ICD-10-CM | POA: Diagnosis present

## 2014-12-02 DIAGNOSIS — Z9889 Other specified postprocedural states: Secondary | ICD-10-CM

## 2014-12-02 DIAGNOSIS — Z888 Allergy status to other drugs, medicaments and biological substances status: Secondary | ICD-10-CM

## 2014-12-02 DIAGNOSIS — J9601 Acute respiratory failure with hypoxia: Secondary | ICD-10-CM | POA: Diagnosis present

## 2014-12-02 DIAGNOSIS — Z79899 Other long term (current) drug therapy: Secondary | ICD-10-CM

## 2014-12-02 DIAGNOSIS — Z833 Family history of diabetes mellitus: Secondary | ICD-10-CM

## 2014-12-02 DIAGNOSIS — E876 Hypokalemia: Secondary | ICD-10-CM | POA: Diagnosis present

## 2014-12-02 DIAGNOSIS — E7800 Pure hypercholesterolemia, unspecified: Secondary | ICD-10-CM | POA: Diagnosis present

## 2014-12-02 DIAGNOSIS — D6489 Other specified anemias: Secondary | ICD-10-CM | POA: Diagnosis present

## 2014-12-02 DIAGNOSIS — R230 Cyanosis: Secondary | ICD-10-CM | POA: Diagnosis present

## 2014-12-02 DIAGNOSIS — J449 Chronic obstructive pulmonary disease, unspecified: Secondary | ICD-10-CM | POA: Diagnosis present

## 2014-12-02 DIAGNOSIS — I739 Peripheral vascular disease, unspecified: Secondary | ICD-10-CM | POA: Diagnosis present

## 2014-12-02 DIAGNOSIS — K922 Gastrointestinal hemorrhage, unspecified: Secondary | ICD-10-CM | POA: Diagnosis present

## 2014-12-02 DIAGNOSIS — IMO0002 Reserved for concepts with insufficient information to code with codable children: Secondary | ICD-10-CM | POA: Diagnosis present

## 2014-12-02 DIAGNOSIS — N179 Acute kidney failure, unspecified: Secondary | ICD-10-CM | POA: Diagnosis present

## 2014-12-02 DIAGNOSIS — I10 Essential (primary) hypertension: Secondary | ICD-10-CM | POA: Diagnosis present

## 2014-12-02 DIAGNOSIS — L97519 Non-pressure chronic ulcer of other part of right foot with unspecified severity: Secondary | ICD-10-CM | POA: Diagnosis present

## 2014-12-02 DIAGNOSIS — E872 Acidosis, unspecified: Secondary | ICD-10-CM

## 2014-12-02 DIAGNOSIS — N170 Acute kidney failure with tubular necrosis: Secondary | ICD-10-CM | POA: Diagnosis present

## 2014-12-02 DIAGNOSIS — Z794 Long term (current) use of insulin: Secondary | ICD-10-CM

## 2014-12-02 DIAGNOSIS — N390 Urinary tract infection, site not specified: Secondary | ICD-10-CM | POA: Diagnosis present

## 2014-12-02 DIAGNOSIS — I469 Cardiac arrest, cause unspecified: Secondary | ICD-10-CM | POA: Diagnosis present

## 2014-12-02 DIAGNOSIS — J189 Pneumonia, unspecified organism: Secondary | ICD-10-CM | POA: Diagnosis present

## 2014-12-02 DIAGNOSIS — B9562 Methicillin resistant Staphylococcus aureus infection as the cause of diseases classified elsewhere: Secondary | ICD-10-CM | POA: Diagnosis present

## 2014-12-02 DIAGNOSIS — R778 Other specified abnormalities of plasma proteins: Secondary | ICD-10-CM | POA: Diagnosis present

## 2014-12-02 DIAGNOSIS — Z87891 Personal history of nicotine dependence: Secondary | ICD-10-CM

## 2014-12-02 DIAGNOSIS — G931 Anoxic brain damage, not elsewhere classified: Secondary | ICD-10-CM | POA: Diagnosis present

## 2014-12-02 DIAGNOSIS — Z8249 Family history of ischemic heart disease and other diseases of the circulatory system: Secondary | ICD-10-CM

## 2014-12-02 DIAGNOSIS — D62 Acute posthemorrhagic anemia: Secondary | ICD-10-CM | POA: Diagnosis present

## 2014-12-02 DIAGNOSIS — I248 Other forms of acute ischemic heart disease: Secondary | ICD-10-CM | POA: Diagnosis present

## 2014-12-02 DIAGNOSIS — A4102 Sepsis due to Methicillin resistant Staphylococcus aureus: Secondary | ICD-10-CM | POA: Diagnosis present

## 2014-12-02 DIAGNOSIS — A419 Sepsis, unspecified organism: Secondary | ICD-10-CM

## 2014-12-02 DIAGNOSIS — M726 Necrotizing fasciitis: Secondary | ICD-10-CM | POA: Diagnosis present

## 2014-12-02 DIAGNOSIS — Z978 Presence of other specified devices: Secondary | ICD-10-CM

## 2014-12-02 DIAGNOSIS — R6520 Severe sepsis without septic shock: Secondary | ICD-10-CM

## 2014-12-02 HISTORY — DX: Hypothyroidism, unspecified: E03.9

## 2014-12-02 HISTORY — DX: Type 2 diabetes mellitus without complications: E11.9

## 2014-12-02 HISTORY — DX: Atherosclerotic heart disease of native coronary artery without angina pectoris: I25.10

## 2014-12-02 LAB — COMPREHENSIVE METABOLIC PANEL
ALBUMIN: 2.2 g/dL — AB (ref 3.5–5.0)
ALK PHOS: 73 U/L (ref 38–126)
ALT: 20 U/L (ref 14–54)
ANION GAP: 23 — AB (ref 5–15)
AST: 77 U/L — ABNORMAL HIGH (ref 15–41)
BUN: 33 mg/dL — ABNORMAL HIGH (ref 6–20)
CO2: 19 mmol/L — AB (ref 22–32)
Calcium: 7.7 mg/dL — ABNORMAL LOW (ref 8.9–10.3)
Chloride: 89 mmol/L — ABNORMAL LOW (ref 101–111)
Creatinine, Ser: 1.94 mg/dL — ABNORMAL HIGH (ref 0.44–1.00)
GFR calc Af Amer: 36 mL/min — ABNORMAL LOW (ref >60)
GFR calc non Af Amer: 31 mL/min — ABNORMAL LOW (ref >60)
GLUCOSE: 375 mg/dL — AB (ref 65–99)
Potassium: 3.1 mmol/L — ABNORMAL LOW (ref 3.5–5.1)
SODIUM: 131 mmol/L — AB (ref 135–145)
Total Bilirubin: 3.2 mg/dL — ABNORMAL HIGH (ref 0.3–1.2)
Total Protein: 6 g/dL — ABNORMAL LOW (ref 6.5–8.1)

## 2014-12-02 LAB — URINALYSIS COMPLETE WITH MICROSCOPIC (ARMC ONLY)
Glucose, UA: >500 mg/dL — AB
LEUKOCYTES UA: NEGATIVE
NITRITE: NEGATIVE
PH: 5 (ref 5.0–8.0)
Protein, ur: >500 mg/dL — AB
SPECIFIC GRAVITY, URINE: 1.028 (ref 1.005–1.030)

## 2014-12-02 LAB — CBC WITH DIFFERENTIAL/PLATELET
BAND NEUTROPHILS: 29 % — AB (ref 0–10)
BASOS ABS: 0 10*3/uL (ref 0.0–0.1)
BASOS PCT: 0 % (ref 0–1)
Blasts: 0 %
EOS ABS: 0 10*3/uL (ref 0.0–0.7)
Eosinophils Relative: 0 % (ref 0–5)
HCT: 30.9 % — ABNORMAL LOW (ref 35.0–47.0)
HEMOGLOBIN: 9.6 g/dL — AB (ref 12.0–16.0)
Lymphocytes Relative: 10 % — ABNORMAL LOW (ref 12–46)
Lymphs Abs: 0.5 10*3/uL — ABNORMAL LOW (ref 0.7–4.0)
MCH: 18.9 pg — ABNORMAL LOW (ref 26.0–34.0)
MCHC: 31.1 g/dL — ABNORMAL LOW (ref 32.0–36.0)
MCV: 60.8 fL — ABNORMAL LOW (ref 80.0–100.0)
METAMYELOCYTES PCT: 2 %
MYELOCYTES: 1 %
Monocytes Absolute: 0.3 10*3/uL (ref 0.1–1.0)
Monocytes Relative: 6 % (ref 3–12)
Neutro Abs: 4.4 10*3/uL (ref 1.7–7.7)
Neutrophils Relative %: 52 % (ref 43–77)
Other: 0 %
PROMYELOCYTES ABS: 0 %
Platelets: 79 10*3/uL — ABNORMAL LOW (ref 150–440)
RBC: 5.09 MIL/uL (ref 3.80–5.20)
RDW: 18.7 % — ABNORMAL HIGH (ref 11.5–14.5)
WBC: 5.2 10*3/uL (ref 3.6–11.0)
nRBC: 0 /100 WBC

## 2014-12-02 LAB — MRSA PCR SCREENING: MRSA by PCR: NEGATIVE

## 2014-12-02 LAB — BLOOD GAS, ARTERIAL
ACID-BASE DEFICIT: 7.5 mmol/L — AB (ref 0.0–2.0)
Allens test (pass/fail): POSITIVE — AB
Bicarbonate: 18.2 mEq/L — ABNORMAL LOW (ref 21.0–28.0)
FIO2: 1
LHR: 18 {breaths}/min
O2 SAT: 95.6 %
PCO2 ART: 37 mmHg (ref 32.0–48.0)
PEEP/CPAP: 5 cmH2O
PH ART: 7.3 — AB (ref 7.350–7.450)
PO2 ART: 87 mmHg (ref 83.0–108.0)
Patient temperature: 37
VT: 450 mL

## 2014-12-02 LAB — LACTIC ACID, PLASMA
LACTIC ACID, VENOUS: 7.3 mmol/L — AB (ref 0.5–2.0)
Lactic Acid, Venous: 5.5 mmol/L (ref 0.5–2.0)

## 2014-12-02 LAB — TROPONIN I
TROPONIN I: 0.09 ng/mL — AB (ref <0.031)
Troponin I: 0.07 ng/mL — ABNORMAL HIGH (ref <0.031)

## 2014-12-02 LAB — GLUCOSE, CAPILLARY: Glucose-Capillary: 311 mg/dL — ABNORMAL HIGH (ref 65–99)

## 2014-12-02 MED ORDER — LEVOTHYROXINE SODIUM 25 MCG PO TABS: 200.0000 ug | ORAL_TABLET | Freq: Every day | ORAL | Status: DC

## 2014-12-02 MED ORDER — NOREPINEPHRINE 4 MG/250ML-% IV SOLN
0.0000 ug/min | INTRAVENOUS | Status: DC
  Administered 2014-12-02: 2 ug/min via INTRAVENOUS
  Administered 2014-12-03: 80 ug/min via INTRAVENOUS
  Administered 2014-12-03: 75 ug/min via INTRAVENOUS
  Filled 2014-12-02 (×3): qty 250

## 2014-12-02 MED ORDER — PANTOPRAZOLE SODIUM 40 MG IV SOLR: 40.0000 mg | Freq: Two times a day (BID) | INTRAVENOUS | Status: DC

## 2014-12-02 MED ORDER — INSULIN ASPART 100 UNIT/ML ~~LOC~~ SOLN
0.0000 [IU] | Freq: Four times a day (QID) | SUBCUTANEOUS | Status: DC
  Administered 2014-12-02: 11 [IU] via SUBCUTANEOUS
  Filled 2014-12-02: qty 11

## 2014-12-02 MED ORDER — DEXTROSE 5 % IV SOLN: 0.0000 ug/min | INTRAVENOUS | Status: DC

## 2014-12-02 MED ORDER — POTASSIUM CHLORIDE 10 MEQ/100ML IV SOLN
10.0000 meq | INTRAVENOUS | Status: AC
  Administered 2014-12-02 – 2014-12-03 (×4): 10 meq via INTRAVENOUS
  Filled 2014-12-02 (×4): qty 100

## 2014-12-02 MED ORDER — CHLORHEXIDINE GLUCONATE 0.12% ORAL RINSE (MEDLINE KIT)
15.0000 mL | Freq: Two times a day (BID) | OROMUCOSAL | Status: DC
  Administered 2014-12-02: 15 mL via OROMUCOSAL
  Filled 2014-12-02 (×3): qty 15

## 2014-12-02 MED ORDER — PIPERACILLIN SOD-TAZOBACTAM SO 3.375 (3-0.375) G IV SOLR
INTRAVENOUS | Status: AC
  Filled 2014-12-02: qty 3.38

## 2014-12-02 MED ORDER — SODIUM CHLORIDE 0.9 % IV SOLN
Freq: Once | INTRAVENOUS | Status: AC
  Administered 2014-12-02: 16:00:00 via INTRAVENOUS

## 2014-12-02 MED ORDER — MOMETASONE FURO-FORMOTEROL FUM 100-5 MCG/ACT IN AERO
2.0000 | INHALATION_SPRAY | Freq: Two times a day (BID) | RESPIRATORY_TRACT | Status: DC
  Filled 2014-12-02: qty 8.8

## 2014-12-02 MED ORDER — PIPERACILLIN-TAZOBACTAM 3.375 G IVPB
3.3750 g | Freq: Once | INTRAVENOUS | Status: AC
  Administered 2014-12-02: 3.375 g via INTRAVENOUS

## 2014-12-02 MED ORDER — PIPERACILLIN-TAZOBACTAM 3.375 G IVPB
3.3750 g | Freq: Three times a day (TID) | INTRAVENOUS | Status: DC
  Administered 2014-12-03 (×2): 3.375 g via INTRAVENOUS
  Filled 2014-12-02 (×6): qty 50

## 2014-12-02 MED ORDER — FENTANYL 2500MCG IN NS 250ML (10MCG/ML) PREMIX INFUSION: 10.0000 ug/h | INTRAVENOUS | Status: DC

## 2014-12-02 MED ORDER — SODIUM CHLORIDE 0.9 % IV SOLN: INTRAVENOUS | Status: DC

## 2014-12-02 MED ORDER — ETOMIDATE 2 MG/ML IV SOLN
INTRAVENOUS | Status: AC | PRN
  Administered 2014-12-02: 20 mg via INTRAVENOUS

## 2014-12-02 MED ORDER — ANTISEPTIC ORAL RINSE SOLUTION (CORINZ)
7.0000 mL | OROMUCOSAL | Status: DC
  Administered 2014-12-02 – 2014-12-03 (×3): 7 mL via OROMUCOSAL
  Filled 2014-12-02 (×13): qty 7

## 2014-12-02 MED ORDER — ACETAMINOPHEN 650 MG RE SUPP
650.0000 mg | RECTAL | Status: DC | PRN
  Administered 2014-12-02: 650 mg via RECTAL
  Filled 2014-12-02: qty 1

## 2014-12-02 MED ORDER — TIOTROPIUM BROMIDE MONOHYDRATE 18 MCG IN CAPS
1.0000 | ORAL_CAPSULE | Freq: Every day | RESPIRATORY_TRACT | Status: DC
  Filled 2014-12-02: qty 5

## 2014-12-02 MED ORDER — PANTOPRAZOLE SODIUM 40 MG IV SOLR
8.0000 mg/h | INTRAVENOUS | Status: DC
  Administered 2014-12-02 – 2014-12-03 (×2): 8 mg/h via INTRAVENOUS
  Filled 2014-12-02 (×2): qty 80

## 2014-12-02 MED ORDER — VECURONIUM BROMIDE 10 MG IV SOLR
10.0000 mg | Freq: Once | INTRAVENOUS | Status: AC
  Administered 2014-12-02: 10 mg via INTRAVENOUS
  Filled 2014-12-02: qty 10

## 2014-12-02 MED ORDER — LORAZEPAM 2 MG/ML IJ SOLN
INTRAMUSCULAR | Status: AC
  Filled 2014-12-02: qty 1

## 2014-12-02 MED ORDER — ACETAMINOPHEN 325 MG PO TABS: 650.0000 mg | ORAL_TABLET | ORAL | Status: DC | PRN

## 2014-12-02 MED ORDER — MIDAZOLAM HCL 5 MG/ML IJ SOLN
1.0000 mg/h | INTRAMUSCULAR | Status: DC
  Administered 2014-12-02 – 2014-12-03 (×2): 1 mg/h via INTRAVENOUS
  Filled 2014-12-02 (×2): qty 10

## 2014-12-02 MED ORDER — SODIUM CHLORIDE 0.9 % IV BOLUS (SEPSIS)
1000.0000 mL | Freq: Once | INTRAVENOUS | Status: AC
  Administered 2014-12-02: 1000 mL via INTRAVENOUS

## 2014-12-02 MED ORDER — LORAZEPAM 2 MG/ML IJ SOLN
2.0000 mg | Freq: Once | INTRAMUSCULAR | Status: AC
  Administered 2014-12-02: 2 mg via INTRAVENOUS

## 2014-12-02 MED ORDER — PANTOPRAZOLE SODIUM 40 MG IV SOLR
40.0000 mg | Freq: Once | INTRAVENOUS | Status: AC
  Administered 2014-12-02: 40 mg via INTRAVENOUS
  Filled 2014-12-02: qty 40

## 2014-12-02 MED ORDER — SODIUM CHLORIDE 0.9 % IV SOLN
INTRAVENOUS | Status: AC
  Administered 2014-12-02: 20:00:00 via INTRAVENOUS

## 2014-12-02 MED ORDER — VANCOMYCIN HCL IN DEXTROSE 1-5 GM/200ML-% IV SOLN: 1000.0000 mg | INTRAVENOUS | Status: DC

## 2014-12-02 MED ORDER — SODIUM CHLORIDE 0.9 % IV SOLN
2.0000 g | Freq: Once | INTRAVENOUS | Status: AC
  Administered 2014-12-02: 2 g via INTRAVENOUS
  Filled 2014-12-02: qty 20

## 2014-12-02 MED ORDER — VANCOMYCIN HCL IN DEXTROSE 1-5 GM/200ML-% IV SOLN
1000.0000 mg | Freq: Once | INTRAVENOUS | Status: AC
  Administered 2014-12-02: 1000 mg via INTRAVENOUS
  Filled 2014-12-02: qty 200

## 2014-12-02 MED ORDER — VANCOMYCIN HCL IN DEXTROSE 750-5 MG/150ML-% IV SOLN
750.0000 mg | INTRAVENOUS | Status: DC
  Administered 2014-12-03: 750 mg via INTRAVENOUS
  Filled 2014-12-02 (×2): qty 150

## 2014-12-02 MED ORDER — SUCCINYLCHOLINE CHLORIDE 20 MG/ML IJ SOLN
INTRAMUSCULAR | Status: AC | PRN
  Administered 2014-12-02: 100 mg via INTRAVENOUS

## 2014-12-02 NOTE — ED Notes (Signed)
MD Jimmye Norman at bedside preparing to intubate, RT call, RIK kit pulled.

## 2014-12-02 NOTE — ED Notes (Signed)
MD Willis at bedside. 

## 2014-12-02 NOTE — Progress Notes (Signed)
ANTIBIOTIC CONSULT NOTE - INITIAL  Pharmacy Consult for Vancomycin/Zosyn Indication: Sepsis  Allergies  Allergen Reactions  . Reglan [Metoclopramide] Shortness Of Breath  . Ibuprofen Itching and Nausea And Vomiting  . Lovenox [Enoxaparin Sodium] Other (See Comments)    Internal bleeding  . Medroxyprogesterone     Other reaction(s): Unknown  . Naproxen     Other reaction(s): Unknown    Patient Measurements: Height:  (157.5 cm) Weight: 123 lb (55.792 kg) IBW/kg (Calculated) : 50.1  Vital Signs: Temp: 103.9 F (39.9 C) (08/16 2000) Temp Source: Oral (08/16 1517) BP: 75/46 mmHg (08/16 2000) Pulse Rate: 44 (08/16 2000) Intake/Output from previous day:   Intake/Output from this shift:    Labs:  Recent Labs  2014/12/21 1529  WBC 5.2  HGB 9.6*  PLT 79*  CREATININE 1.94*   Estimated Creatinine Clearance: 29.9 mL/min (by C-G formula based on Cr of 1.94). No results for input(s): VANCOTROUGH, VANCOPEAK, VANCORANDOM, GENTTROUGH, GENTPEAK, GENTRANDOM, TOBRATROUGH, TOBRAPEAK, TOBRARND, AMIKACINPEAK, AMIKACINTROU, AMIKACIN in the last 72 hours.   Microbiology: Recent Results (from the past 720 hour(s))  CULTURE, URINE COMPREHENSIVE     Status: None   Collection Time: 11/10/14 12:27 PM  Result Value Ref Range Status   Urine Culture, Comprehensive Final report  Final   Result 1 Comment  Final    Comment: Mixed urogenital flora 10,000-25,000 colony forming units per mL     Medical History: Past Medical History  Diagnosis Date  . Muscle pain   . Hypothyroidism   . Type 2 diabetes mellitus   . Fatigue   . Poor circulation   . History of blood clots   . Emphysema/COPD   . Anemia   . Bruises easily   . Depression   . Frequent headaches   . CAD (coronary artery disease)     Medications:  Scheduled:  . antiseptic oral rinse  7 mL Mouth Rinse Q2H while awake  . chlorhexidine gluconate  15 mL Mouth Rinse BID  . insulin aspart  0-15 Units Subcutaneous Q6H  .  [START ON 12/01/2014] levothyroxine  200 mcg Per Tube QAC breakfast  . mometasone-formoterol  2 puff Inhalation BID  . piperacillin-tazobactam (ZOSYN)  IV  3.375 g Intravenous 3 times per day  . sodium chloride  1,000 mL Intravenous Once  . [START ON 12/07/2014] tiotropium  1 capsule Inhalation Daily  . [START ON 12/07/2014] vancomycin  750 mg Intravenous Q24H   Infusions:  . sodium chloride 150 mL/hr at Dec 21, 2014 2021  . fentaNYL infusion INTRAVENOUS    . midazolam (VERSED) infusion 1 mg/hr (2014-12-21 1635)  . pantoprozole (PROTONIX) infusion     PRN: acetaminophen, acetaminophen  Assessment: 44 y/o F with severe sepsis and acute respiratory requiring intubation.   Goal of Therapy:  Vancomycin trough level 15-20 mcg/ml  Plan:  1. Vancomycin 1000 mg iv once given in ED. Will order vancomycin 750 mg iv q 24 hours with stacked dosing and a trough with the 4th dose.   2. Zosyn 3.375 g EI q 8 h.    Luisa Hart D 12/21/2014,8:23 PM

## 2014-12-02 NOTE — H&P (Addendum)
[facility] - [place] [facility]   PATIENT NAME: [name] [name]    MR#:  [id number]  DATE OF BIRTH:  [birth year]  DATE OF ADMISSION:  2014-12-10  PRIMARY CARE PHYSICIAN: Lorie Phenix, MD   REQUESTING/REFERRING PHYSICIAN: Mayford Knife, M.D.  CHIEF COMPLAINT:   Chief Complaint  Patient presents with  . Altered Mental Status    HISTORY OF PRESENT ILLNESS:  [name] [name]  is a 44 y.o. female who presents with altered mental status, tachypnea and subsequent acute respiratory failure, tachycardia, due to sepsis. Patient is intubated at time of interview and unable to give her history, her husband is at bedside and provides a history for her. He states that 3 days ago she began to show signs of confusion and lethargy. She stated multiple times over the past few days that she has very cold. She steadily decline in her mental status until he decided to bring her in to the ED for evaluation today. She did have some episodes of vomiting, and has had decreased appetite over that time. As of yesterday her emesis was only clear, as she had only been drinking water. Her husband also noticed some oozing from her right foot callus, and a hematoma appearing swelling around her left foot callus when he brought her to the ED today. In the ED the patient was found to have a lactic acid of 7, she was tachypneic and tachycardic, required intubation due to her increased work of breathing and hypoxia. Hospitalists were called for admission for sepsis and acute respiratory failure.  PAST MEDICAL HISTORY:   Past Medical History  Diagnosis Date  . Muscle pain   . Hypothyroidism   . Type 2 diabetes mellitus   . Fatigue   . Poor circulation   . History of blood clots   . Emphysema/COPD   . Anemia   . Bruises easily   . Depression   . Frequent headaches   . CAD (coronary artery disease)     PAST SURGICAL HISTORY:   Past Surgical History  Procedure Laterality Date  .  Exploratory surgery thigh    . Internal bleeding/blood clots      2 Femoral and 1 IVC filters placed 2007  . Abdominal hysterectomy  1998    Bilateral oophrectomy 1999  . Hernia repair      Fermoral artery durning exploratory lap for presumed hernia in 2003, with subsequent repair, and vascularization x's 2 with muscle flap/skin graft.   . Decubitus ulcer excision  04/2007    Repair, wound re-do    SOCIAL HISTORY:   Social History  Substance Use Topics  . Smoking status: Former Games developer  . Smokeless tobacco: Never Used  . Alcohol Use: No    FAMILY HISTORY:   Family History  Problem Relation Age of Onset  . Hypertension Mother   . Diabetes Mother   . Stroke Mother     DRUG ALLERGIES:   Allergies  Allergen Reactions  . Reglan [Metoclopramide] Shortness Of Breath  . Ibuprofen Itching and Nausea And Vomiting  . Lovenox [Enoxaparin Sodium] Other (See Comments)    Internal bleeding  . Medroxyprogesterone     Other reaction(s): Unknown  . Naproxen     Other reaction(s): Unknown    MEDICATIONS AT HOME:   Prior to Admission medications   Medication Sig Start Date End Date Taking? Authorizing Provider  atorvastatin (LIPITOR) 10 MG tablet Take by mouth. 05/22/14  Yes Historical Provider, MD  cefUROXime (CEFTIN) 250 MG tablet  Take 1 tablet (250 mg total) by mouth 2 (two) times daily with a meal. 11/10/14  Yes Lorie Phenix, MD  Cyanocobalamin (VITAMIN B 12 PO) Take 1,000 mcg by mouth daily.    Yes Historical Provider, MD  DULERA 100-5 MCG/ACT AERO inhale 2 puffs by mouth every 12 hours 10/14/14  Yes Lorie Phenix, MD  DULoxetine (CYMBALTA) 60 MG capsule Take 60 mg by mouth daily.   Yes Historical Provider, MD  Ergocalciferol (VITAMIN D2 PO) Take 50,000 Int'l Units by mouth every Wednesday.    Yes Historical Provider, MD  eszopiclone (LUNESTA) 2 MG TABS tablet take 1 tablet by mouth at bedtime 11/07/14  Yes Lorie Phenix, MD  gabapentin (NEURONTIN) 100 MG capsule take 1 capsule by  mouth three times a day 10/10/14  Yes Malva Limes, MD  HYDROmorphone (DILAUDID) 4 MG tablet Take 1 tablet (4 mg total) by mouth every 4 (four) hours as needed for moderate pain. 11/20/14  Yes Lorie Phenix, MD  hydrOXYzine (ATARAX/VISTARIL) 25 MG tablet Take 25 mg by mouth every 4 (four) hours as needed.  02/10/14  Yes Historical Provider, MD  insulin NPH-regular Human (NOVOLIN 70/30) (70-30) 100 UNIT/ML injection Inject 4-12 Units into the skin 2 (two) times daily with a meal.  06/17/13  Yes Historical Provider, MD  ketoconazole (NIZORAL) 2 % cream KETOCONAZOLE, 2% (External Cream)  1 Cream apply to affected area qd for 0 days  Quantity: 30;  Refills: 5   Ordered :23-May-2014  Lorie Phenix MD;  Started 23-May-2014 Active Comments: DX: 110.5 05/23/14  Yes Historical Provider, MD  levothyroxine (SYNTHROID, LEVOTHROID) 200 MCG tablet Take by mouth. 01/03/14  Yes Historical Provider, MD  LORazepam (ATIVAN) 0.5 MG tablet take 1 tablet by mouth 1-2 TIMES A DAY 10/10/14  Yes Malva Limes, MD  methadone (DOLOPHINE) 10 MG tablet Take 1 tablet (10 mg total) by mouth 5 (five) times daily. 11/20/14  Yes Lorie Phenix, MD  metoprolol succinate (TOPROL-XL) 50 MG 24 hr tablet Take by mouth. 08/11/14  Yes Historical Provider, MD  nortriptyline (PAMELOR) 50 MG capsule Take by mouth. 06/14/14  Yes Historical Provider, MD  omeprazole (PRILOSEC) 40 MG capsule Take by mouth. 01/03/14  Yes Historical Provider, MD  ondansetron (ZOFRAN ODT) 4 MG disintegrating tablet Take by mouth.   Yes Historical Provider, MD  terconazole (TERAZOL 7) 0.4 % vaginal cream Place 1 applicator vaginally at bedtime. 11/18/14  Yes Lorie Phenix, MD  tiotropium (SPIRIVA HANDIHALER) 18 MCG inhalation capsule Place 1 capsule (18 mcg total) into inhaler and inhale daily. 09/17/14  Yes Lorie Phenix, MD  TOUJEO SOLOSTAR 300 UNIT/ML SOPN Inject 15 Units into the skin daily.  09/01/14  Yes Historical Provider, MD  triamterene-hydrochlorothiazide (MAXZIDE-25)  37.5-25 MG per tablet take 1 tablet by mouth once daily 10/10/14  Yes Malva Limes, MD  zolpidem (AMBIEN) 10 MG tablet Take 10 mg by mouth at bedtime as needed for sleep.   Yes Historical Provider, MD    REVIEW OF SYSTEMS:  Review of Systems  Unable to perform ROS: critical illness     VITAL SIGNS:   Filed Vitals:   11/26/2014 1705 11/29/2014 1707 12/09/2014 1710 11/21/2014 1712  BP: 103/65 101/68 98/68 101/68  Pulse: 129 129 130 130  Temp: 101.3 F (38.5 C) 101.3 F (38.5 C) 101.4 F (38.6 C) 101.4 F (38.6 C)  TempSrc:      Resp: 21 20 21 22   Height:      Weight:  SpO2: 100% 100% 100% 100%   Wt Readings from Last 3 Encounters:  December 26, 2014 55.792 kg (123 lb)  09/29/14 54.432 kg (120 lb)  07/25/14 61.236 kg (135 lb)    PHYSICAL EXAMINATION:  Physical Exam  Vitals reviewed. Constitutional: She appears well-developed. No distress.  HENT:  Head: Normocephalic and atraumatic.  Mouth/Throat: Oropharynx is clear and moist.  Eyes: Conjunctivae and EOM are normal. Pupils are equal, round, and reactive to light. No scleral icterus.  Neck: Normal range of motion. Neck supple. No JVD present. No thyromegaly present.  Cardiovascular: Regular rhythm and intact distal pulses.  Exam reveals no gallop and no friction rub.   No murmur heard. Tachycardic  Respiratory: No respiratory distress (though currently intubated). She has no wheezes. She has no rales.  Coarse breath sounds bilaterally, worse at the bases  GI: Soft. She exhibits no distension. There is no tenderness.  Hyperactive crescendo bowel sounds  Musculoskeletal: Normal range of motion. She exhibits no edema.  No arthritis, no gout  Lymphadenopathy:    She has no cervical adenopathy.  Neurological:  Unable to assess due to critical illness  Skin: Skin is warm and dry. No rash noted. No erythema.  Bilateral foot calluses, right foot has some serous to mildly purulent drainage with mild palpation, left foot has dark  fluid-filled pocket extending down from the callus  Psychiatric:  Unable to assess due to critical illness    LABORATORY PANEL:   CBC No results for input(s): WBC, HGB, HCT, PLT in the last 168 hours. ------------------------------------------------------------------------------------------------------------------  Chemistries   Recent Labs Lab 2014/12/26 1529  NA 131*  K 3.1*  CL 89*  CO2 19*  GLUCOSE 375*  BUN 33*  CREATININE 1.94*  CALCIUM 7.7*  AST 77*  ALT 20  ALKPHOS 73  BILITOT 3.2*   ------------------------------------------------------------------------------------------------------------------  Cardiac Enzymes  Recent Labs Lab 12-26-2014 1529  TROPONINI 0.07*   ------------------------------------------------------------------------------------------------------------------  RADIOLOGY:  Dg Chest 1 View  26-Dec-2014   CLINICAL DATA:  Sepsis  EXAM: CHEST  1 VIEW  COMPARISON:  10/01/2014  FINDINGS: Low lung volumes are present, causing crowding of the pulmonary vasculature. Indistinct bandlike and nodular opacities along the left lung base. Suspected volume loss along the right hemidiaphragm.  Upper normal heart size. The patient is rotated to the right on today's radiograph, reducing diagnostic sensitivity and specificity. No blunting of the costophrenic angles.  IMPRESSION: 1. Bandlike in nodular opacities at the left lung base are nonspecific but could represent early or atypical infectious process. Followup radiography to ensure clearance is recommended. 2. Subsegmental atelectasis along the right hemidiaphragm.   Electronically Signed   By: Gaylyn Rong M.D.   On: 2014-12-26 16:07   Dg Chest Portable 1 View  12/26/14   CLINICAL DATA:  Post intubation  EXAM: PORTABLE CHEST - 1 VIEW  COMPARISON:  2014/12/26  FINDINGS: Cardiomediastinal silhouette is stable. Endotracheal tube in place with tip 2 cm above the carina. NG tube in place. There is again noted  streaky airspace disease bilateral lower lobe. Atypical infection cannot be excluded. No pneumothorax.  IMPRESSION: Endotracheal tube in place with tip 2 cm above the carina. NG tube in place. There is again noted streaky airspace disease bilateral lower lobe. Atypical infection cannot be excluded.   Electronically Signed   By: Natasha Mead M.D.   On: 12/26/14 16:51    EKG:   Orders placed or performed in visit on 03/28/14  . EKG 12-Lead    IMPRESSION AND PLAN:  Principal Problem:   Severe sepsis with acute organ dysfunction - blood and urine cultures sent from the ED, CBC pending, broad-spectrum IV antibiotics started, lactic acid was initially significantly elevated at 7, patient received 3 L normal saline in the ED, we will recheck lactic acid serially, working to identify source, see treatment of other problems below. Blood pressure remains borderline stable, we'll not initiate pressors at this time, we'll try to maintain good blood pressure post support with continued aggressive IV fluids Active Problems:   Acute respiratory failure with hypoxemia - chest x-ray did not show any significant pneumonia, questionable for possible developing pneumonia, unlikely source for infection, however her straight failure is likely more due to her severe sepsis. Patient is intubated, will maintain sedation and mechanical ventilation at this time until patient improves enough for extubation. We'll recheck an ABG 1 hour after intubation, and as needed afterwards.   Foot ulcer - bilateral calluses, right foot with ulceration and drainage, left foot with dark fluid-filled pocket extending down from the callus. We'll get a CT of the left foot to rule out necrotizing fasciitis, and order podiatry consult to evaluate for possible intervention of the calluses as a potential source of infection. If CT does show necrotizing fasciitis she will obviously need a stat surgical consult.   GI bleed - patient has significantly  dark output from her OG tube, we will check this for blood via Hemoccult/guaiac, we'll put her on Protonix drip for now and she has a history of GI bleed in the past. If her emesis is Hemoccult positive she'll need a GI consult.   Acute kidney injury - likely due to sepsis and her hypotension, will monitor for improvement with aggressive fluid administration. Consider consult to nephrology if patient is not improving.   Elevated troponin - patient has a history of CAD with multiple stents, troponin is not significantly elevated, we'll continue to trend for now, possibly due to demand ischemia from her tachycardia.   Essential (primary) hypertension - hold home antihypertensives as the patient is borderline hypotensive at this time.   Type II diabetes mellitus, uncontrolled - hold home anti-glycemic medications, we will initiate sliding scale insulin this time every 6 hours with appropriate fingerstick glucose checks.   CAD (coronary artery disease) - we will hold her aspirin for now due to possible GI bleed, and hold her other CAD medications at this time as they will likely lower her blood pressure. These will need to be restarted when her vitals improved.   Hypercholesteremia - patient is nothing by mouth at this time, hold statin for now, restart when patient's condition improves.   Adult hypothyroidism - we will continue her home dose of thyroid replacement.  All the records are reviewed and case discussed with ED provider. Management plans discussed with the patient and/or family.  DVT PROPHYLAXIS: mechanical only  ADMISSION STATUS: Inpatient  CODE STATUS: Full  TOTAL CRITICAL CARE TIME TAKING CARE OF THIS PATIENT: 60 minutes.    [name] [name] FIELDING 11/26/2014, 5:54 PM  [facility]  Office  [phone]  CC: Primary care physician; Lorie Phenix, MD

## 2014-12-02 NOTE — Consult Note (Addendum)
PHARMACY - CRITICAL CARE PROGRESS NOTE  Pharmacy Consult for electrolyte managment Indication:    Allergies  Allergen Reactions  . Reglan [Metoclopramide] Shortness Of Breath  . Ibuprofen Itching and Nausea And Vomiting  . Lovenox [Enoxaparin Sodium] Other (See Comments)    Internal bleeding  . Medroxyprogesterone     Other reaction(s): Unknown  . Naproxen     Other reaction(s): Unknown    Patient Measurements: Height:  (162.6 cm) Weight: 133 lb 13.1 oz (60.7 kg) IBW/kg (Calculated) : 54.7 Adjusted Body Weight:   Vital Signs: Temp: 103.6 F (39.8 C) (08/16 2030) Temp Source: Oral (08/16 1517) BP: 63/48 mmHg (08/16 2115) Pulse Rate: 117 (08/16 2115) Intake/Output from previous day:   Intake/Output from this shift: Total I/O In: 0.5 [I.V.:0.5] Out: -  Vent settings for last 24 hours: Vent Mode:  [-] PRVC FiO2 (%):  [100 %] 100 % Set Rate:  [18 bmp] 18 bmp Vt Set:  [450 mL] 450 mL PEEP:  [5 cmH20] 5 cmH20  Labs:  Recent Labs  26-Dec-2014 1529  WBC 5.2  HGB 9.6*  HCT 30.9*  PLT 79*  CREATININE 1.94*  ALBUMIN 2.2*  PROT 6.0*  AST 77*  ALT 20  ALKPHOS 73  BILITOT 3.2*   Estimated Creatinine Clearance: 32.6 mL/min (by C-G formula based on Cr of 1.94).   Recent Labs  2014-12-26 2025  GLUCAP 311*    Microbiology: Recent Results (from the past 720 hour(s))  CULTURE, URINE COMPREHENSIVE     Status: None   Collection Time: 11/10/14 12:27 PM  Result Value Ref Range Status   Urine Culture, Comprehensive Final report  Final   Result 1 Comment  Final    Comment: Mixed urogenital flora 10,000-25,000 colony forming units per mL     Medications:  Scheduled:  . antiseptic oral rinse  7 mL Mouth Rinse Q2H while awake  . chlorhexidine gluconate  15 mL Mouth Rinse BID  . insulin aspart  0-15 Units Subcutaneous Q6H  . [START ON 11/26/2014] levothyroxine  200 mcg Per Tube QAC breakfast  . mometasone-formoterol  2 puff Inhalation BID  .  piperacillin-tazobactam (ZOSYN)  IV  3.375 g Intravenous 3 times per day  . [START ON 12/15/2014] tiotropium  1 capsule Inhalation Daily  . [START ON 12/17/2014] vancomycin  750 mg Intravenous Q24H   Infusions:  . sodium chloride 150 mL/hr at 12-26-2014 2021  . fentaNYL infusion INTRAVENOUS    . midazolam (VERSED) infusion 3 mg/hr (December 26, 2014 2021)  . norepinephrine 3 mcg/min (12/26/2014 2143)  . pantoprozole (PROTONIX) infusion 8 mg/hr (26-Dec-2014 2031)    Assessment: Pt is a 44 year old female with sepsis, intubated and unresponsive. Pharmacy consulted to correct electrolytes. K=3.1  Goal of Therapy:  Normalize electrolytes   Plan:  Give 40 meq of K over 4 hours (10Meq/hr) once. Recheck in AM  Makenzye Troutman D Bryden Darden 12-26-14,9:51 PM

## 2014-12-02 NOTE — Progress Notes (Signed)
MD notified despite 5L NS boluses map still below 65. Levophed ordered.

## 2014-12-02 NOTE — ED Notes (Addendum)
Pt to ED with AMS since Thursday. Pt has extensive medical hx of vascular issues. Pt is altered on arrival, tahcy at 145, resp rate 37f 50, BP 99/79. MD called to room at this time.

## 2014-12-02 NOTE — ED Provider Notes (Signed)
[facility] Emergency Department Provider Note     Time seen: ----------------------------------------- 3:20 PM on 12/14/2014 -----------------------------------------    I have reviewed the triage vital signs and the nursing notes.  L5 caveat: Review of systems and history cannot be obtained due to altered mental status HISTORY  Chief Complaint Altered Mental Status    HPI [name] [name] is a 44 y.o. female brought the ER for altered mental status since Thursday. Patient's extensive medical history vascular issues, is altered on arrival with a heart rate in the 140s respiratory or 50 blood pressure borderline low. Patient reportedly recently seen Hospital for sepsis, husband states last weekend she was doing well.   Past Medical History  Diagnosis Date  . Muscle pain   . Thyroid disease   . Diabetes   . Fatigue   . Poor circulation   . History of blood clots   . Emphysema/COPD   . Anemia   . Bruises easily   . Depression   . Frequent headaches     Patient Active Problem List   Diagnosis Date Noted  . Recurrent cystitis 11/10/2014  . Absolute anemia 09/25/2014  . History of anticoagulant therapy 09/25/2014  . Chronic headache 09/25/2014  . Excessive falling 09/25/2014  . Callus of foot 09/25/2014  . Foot pain 09/25/2014  . 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 H/O cardiovascular disorder 09/25/2014  . Hypercholesteremia 09/25/2014  . Cannot sleep 09/25/2014  . Amnesia 09/25/2014  . Type II diabetes mellitus, uncontrolled 09/25/2014  . Avitaminosis D 09/25/2014  . Depletion of volume of extracellular fluid 09/25/2014  . Nausea with vomiting 09/16/2014  . Hypokalemia 09/16/2014  . Sepsis 09/13/2014  . UTI (lower urinary tract infection) 09/13/2014  . Ulcer of other part of foot 01/10/2014  . Clinical depression 04/10/2009  . Hypoxemia 07/09/2007  . Feeling bilious 12/28/2006  . Abnormal LFTs 03/27/2006  . Enlarged liver 03/27/2006  .  Current tobacco use 03/27/2006  . Anxiety 12/08/2005  . Awareness of heartbeats 12/08/2005  . Breathlessness lying flat 06/08/2005  . Chronic pain following surgery or procedure 03/30/2005  . Acid reflux 03/30/2005  . Essential (primary) hypertension 03/30/2005  . Adult hypothyroidism 03/30/2005  . Menopausal and postmenopausal disorder 03/30/2005  . Phlebitis or thrombophlebitis during or resulting from a procedure 03/17/2005  . Infected postoperative seroma 04/18/1898    Past Surgical History  Procedure Laterality Date  . Exploratory surgery thigh    . Internal bleeding/blood clots      2 Femoral and 1 IVC filters placed 2007  . Abdominal hysterectomy  1998    Bilateral oophrectomy 1999  . Hernia repair      Fermoral artery durning exploratory lap for presumed hernia in 2003, with subsequent repair, and vascularization x's 2 with muscle flap/skin graft.   . Decubitus ulcer excision  04/2007    Repair, wound re-do    Allergies Reglan; Ibuprofen; Lovenox; Medroxyprogesterone; and Naproxen  Social History Social History  Substance Use Topics  . Smoking status: Former Games developer  . Smokeless tobacco: Never Used  . Alcohol Use: No    Review of Systems Constitutional: Negative for fever. Respiratory: Positive for fast breathing and shortness of breath Gastrointestinal: Positive for vomiting Musculoskeletal: Positive for left foot swelling Skin: Positive for erythema and left foot Neurological: Positive for confusion and altered mental status  10-point ROS otherwise negative or unknown  ____________________________________________   PHYSICAL EXAM:  VITAL SIGNS: ED Triage Vitals  Enc Vitals Group     BP  12/04/2014 1517 92/45 mmHg     Pulse Rate 11/26/2014 1517 145     Resp 12/07/2014 1517 54     Temp 11/28/2014 1517 97.8 F (36.6 C)     Temp Source 12/09/2014 1517 Oral     SpO2 11/29/2014 1517 99 %     Weight 12/12/2014 1517 123 lb (55.792 kg)     Height 12/02/14 1517 5\' 2"   (1.575 m)     Head Cir --      Peak Flow --      Pain Score --      Pain Loc --      Pain Edu? --      Excl. in GC? --     Constitutional: Alert but disoriented, moderate to severe distress Eyes: Pale conjunctiva Conjunctivae. PERRL. Normal extraocular movements. ENT   Head: Normocephalic and atraumatic.   Nose: No congestion/rhinnorhea.   Mouth/Throat: Mucous membranes are moist.   Neck: No stridor. Cardiovascular: Rapid rate, regular rhythm Respiratory: Significant tachypnea but lungs are clear bilaterally Gastrointestinal: Soft and nontender.  Musculoskeletal: Left lower extremity is swollen and erythematous, foot is also swollen with blistering noted on the plantar surface Neurologic:  Patient is confused, cannot cooperate with exam Skin:  Erythema noted to the left foot with blister formation Psychiatric: patient confused ____________________________________________  EKG: Interpreted by me. Sinus tachycardia with a rate of 145 bpm, otherwise normal axis normal intervals. No evidence of hypertrophy or acute infarction.  ____________________________________________  ED COURSE:  Pertinent labs & imaging results that were available during my care of the patient were reviewed by me and considered in my medical decision making (see chart for details). Patient appears septic or in severe DKA. Will need aggressive fluid hydration, antibiotics and likely intubation with central venous line placement for pressors   OG tube was placed, patient with coffee ground material out of the OG tube. IV Protonix being given. ____________________________________________    LABS (pertinent positives/negatives)  Labs Reviewed  COMPREHENSIVE METABOLIC PANEL - Abnormal; Notable for the following:    Sodium 131 (*)    Potassium 3.1 (*)    Chloride 89 (*)    CO2 19 (*)    Glucose, Bld 375 (*)    BUN 33 (*)    Creatinine, Ser 1.94 (*)    Calcium 7.7 (*)    Total Protein 6.0 (*)     Albumin 2.2 (*)    AST 77 (*)    Total Bilirubin 3.2 (*)    GFR calc non Af Amer 31 (*)    GFR calc Af Amer 36 (*)    Anion gap 23 (*)    All other components within normal limits  TROPONIN I - Abnormal; Notable for the following:    Troponin I 0.07 (*)    All other components within normal limits  LACTIC ACID, PLASMA - Abnormal; Notable for the following:    Lactic Acid, Venous 7.3 (*)    All other components within normal limits  BLOOD GAS, ARTERIAL - Abnormal; Notable for the following:    pH, Arterial 7.30 (*)    Bicarbonate 18.2 (*)    Acid-base deficit 7.5 (*)    Allens test (pass/fail) POSITIVE (*)    All other components within normal limits  CULTURE, BLOOD (ROUTINE X 2)  CULTURE, BLOOD (ROUTINE X 2)  URINE CULTURE  CBC WITH DIFFERENTIAL/PLATELET  URINALYSIS COMPLETEWITH MICROSCOPIC ([facility] ONLY)  LACTIC ACID, PLASMA    RADIOLOGY Images were viewed by me  Chest  x-ray IMPRESSION: 1. Bandlike in nodular opacities at the left lung base are nonspecific but could represent early or atypical infectious process. Followup radiography to ensure clearance is recommended. 2. Subsegmental atelectasis along the right hemidiaphragm. ____________________________________________  CRITICAL CARE Performed by: Emily Filbert   Total critical care time: 30 minutes  Critical care time was exclusive of separately billable procedures and treating other patients.  Critical care was necessary to treat or prevent imminent or life-threatening deterioration.  Critical care was time spent personally by me on the following activities: development of treatment plan with patient and/or surrogate as well as nursing, discussions with consultants, evaluation of patient's response to treatment, examination of patient, obtaining history from patient or surrogate, ordering and performing treatments and interventions, ordering and review of laboratory studies, ordering and review of  radiographic studies, pulse oximetry and re-evaluation of patient's condition.  INTUBATION Performed by: Daryel November E  Required items: required blood products, implants, devices, and special equipment available Patient identity confirmed: provided demographic data and hospital-assigned identification number Time out: Immediately prior to procedure a "time out" was called to verify the correct patient, procedure, equipment, support staff and site/side marked as required.  Indications: Tachypnea, altered mental status, likely sepsis   Intubation method: Glidescope Laryngoscopy   Preoxygenation: 100% BVM  Sedatives:  Etomidate Paralytic:  Succinylcholine  Tube Size: 7.5 cuffed  Post-procedure assessment: chest rise and ETCO2 monitor Breath sounds: equal and absent over the epigastrium Tube secured with: ETT holder Chest x-ray interpreted by radiologist and me.  Chest x-ray findings: 7.5 endotracheal tube in appropriate position  Patient tolerated the procedure well with no immediate complications.   CENTRAL LINE Performed by: Daryel November E Consent: The procedure was performed in an emergent situation. Required items: required blood products, implants, devices, and special equipment available Patient identity confirmed: arm band and provided demographic data Time out: Immediately prior to procedure a "time out" was called to verify the correct patient, procedure, equipment, support staff and site/side marked as required. Indications: vascular access, severe sepsis  Patient sedated: no Preparation: skin prepped with 2% chlorhexidine Skin prep agent dried: skin prep agent completely dried prior to procedure Sterile barriers: all five maximum sterile barriers used - cap, mask, sterile gown, sterile gloves, and large sterile sheet Hand hygiene: hand hygiene performed prior to central venous catheter insertion  Location details: Right femoral vein  Catheter  type: triple lumen Catheter size: 8 Fr Pre-procedure: landmarks identified Successful placement: yes Post-procedure: line sutured and dressing applied Assessment: blood return through all parts, free fluid flow Patient tolerance: Patient tolerated the procedure well with no immediate complications.   FINAL ASSESSMENT AND PLAN  Sepsis, central venous line placement, rapid sequence intubation, acute renal insufficiency, hypocalcemia  Plan: Patient with labs and imaging as dictated above. Patient with evidence of sepsis and appears gravely ill. This was discussed with the family. She was started on broad-spectrum antibiotics and had received 3 L of normal saline IV fluid bolus. Case is discussed with the hospitalist who has agreed to admit the patient. Unclear etiology for the source of infection at this time.   Emily Filbert, MD   Emily Filbert, MD 12/05/2014 [phone]

## 2014-12-02 NOTE — Progress Notes (Signed)
MD notified of patient admitted on vent unresponsive with no CT head done. Temp 104 with no medication or treatment ordered. Cooling blanket, tylenol, and CT head ordered. Also notified of low BP. NS bolus ordered.

## 2014-12-02 NOTE — ED Notes (Signed)
Pt intubated at this time via MD williams. MD williams at bedside placing central line at this time

## 2014-12-03 ENCOUNTER — Other Ambulatory Visit: Payer: Self-pay | Admitting: Family Medicine

## 2014-12-03 ENCOUNTER — Inpatient Hospital Stay: Payer: Medicare PPO

## 2014-12-03 ENCOUNTER — Telehealth: Payer: Self-pay | Admitting: Family Medicine

## 2014-12-03 LAB — BASIC METABOLIC PANEL
ANION GAP: 15 (ref 5–15)
ANION GAP: 28 — AB (ref 5–15)
BUN: 38 mg/dL — ABNORMAL HIGH (ref 6–20)
BUN: 39 mg/dL — ABNORMAL HIGH (ref 6–20)
CALCIUM: 6.7 mg/dL — AB (ref 8.9–10.3)
CHLORIDE: 100 mmol/L — AB (ref 101–111)
CO2: 20 mmol/L — AB (ref 22–32)
CO2: 17 mmol/L — AB (ref 22–32)
Calcium: 6.8 mg/dL — ABNORMAL LOW (ref 8.9–10.3)
Chloride: 104 mmol/L (ref 101–111)
Creatinine, Ser: 2.26 mg/dL — ABNORMAL HIGH (ref 0.44–1.00)
Creatinine, Ser: 2.53 mg/dL — ABNORMAL HIGH (ref 0.44–1.00)
GFR calc Af Amer: 30 mL/min — ABNORMAL LOW (ref >60)
GFR calc non Af Amer: 26 mL/min — ABNORMAL LOW (ref >60)
GFR calc non Af Amer: 22 mL/min — ABNORMAL LOW (ref >60)
GFR, EST AFRICAN AMERICAN: 26 mL/min — AB (ref >60)
GLUCOSE: 303 mg/dL — AB (ref 65–99)
GLUCOSE: 299 mg/dL — AB (ref 65–99)
POTASSIUM: 3.5 mmol/L (ref 3.5–5.1)
Potassium: 4 mmol/L (ref 3.5–5.1)
Sodium: 139 mmol/L (ref 135–145)
Sodium: 145 mmol/L (ref 135–145)

## 2014-12-03 LAB — GLUCOSE, CAPILLARY
GLUCOSE-CAPILLARY: 267 mg/dL — AB (ref 65–99)
Glucose-Capillary: 208 mg/dL — ABNORMAL HIGH (ref 65–99)

## 2014-12-03 LAB — PROTIME-INR
INR: 1.94
Prothrombin Time: 22.3 seconds — ABNORMAL HIGH (ref 11.4–15.0)

## 2014-12-03 LAB — HEMOGLOBIN A1C: Hgb A1c MFr Bld: 11.6 % — ABNORMAL HIGH (ref 4.0–6.0)

## 2014-12-03 LAB — CBC
HEMATOCRIT: 25.2 % — AB (ref 35.0–47.0)
HEMOGLOBIN: 7.6 g/dL — AB (ref 12.0–16.0)
MCH: 19 pg — AB (ref 26.0–34.0)
MCHC: 30.3 g/dL — AB (ref 32.0–36.0)
MCV: 62.7 fL — AB (ref 80.0–100.0)
Platelets: 169 10*3/uL (ref 150–440)
RBC: 4.01 MIL/uL (ref 3.80–5.20)
RDW: 19.7 % — ABNORMAL HIGH (ref 11.5–14.5)
WBC: 10.1 10*3/uL (ref 3.6–11.0)

## 2014-12-03 LAB — PHOSPHORUS: PHOSPHORUS: 10.1 mg/dL — AB (ref 2.5–4.6)

## 2014-12-03 LAB — MAGNESIUM: Magnesium: 1.9 mg/dL (ref 1.7–2.4)

## 2014-12-03 LAB — TROPONIN I
TROPONIN I: 0.42 ng/mL — AB (ref <0.031)
Troponin I: 0.08 ng/mL — ABNORMAL HIGH (ref <0.031)

## 2014-12-03 LAB — LACTIC ACID, PLASMA: LACTIC ACID, VENOUS: 11.7 mmol/L — AB (ref 0.5–2.0)

## 2014-12-03 MED ORDER — STERILE WATER FOR INJECTION IV SOLN
INTRAVENOUS | Status: DC
  Filled 2014-12-03 (×4): qty 850

## 2014-12-03 MED ORDER — EPINEPHRINE HCL 1 MG/ML IJ SOLN
1.0000 mg | Freq: Once | INTRAMUSCULAR | Status: AC
  Administered 2014-12-03: 1 mg via INTRAVENOUS
  Filled 2014-12-03: qty 1

## 2014-12-03 MED ORDER — POTASSIUM CHLORIDE 10 MEQ/100ML IV SOLN
10.0000 meq | INTRAVENOUS | Status: AC
  Administered 2014-12-03 (×2): 10 meq via INTRAVENOUS
  Filled 2014-12-03 (×4): qty 100

## 2014-12-03 MED ORDER — SODIUM BICARBONATE 8.4 % IV SOLN
INTRAVENOUS | Status: AC
  Administered 2014-12-03: 100 meq via INTRAVENOUS
  Filled 2014-12-03: qty 100

## 2014-12-03 MED ORDER — NOREPINEPHRINE 4 MG/250ML-% IV SOLN
INTRAVENOUS | Status: AC
  Administered 2014-12-03: 80 ug/min via INTRAVENOUS
  Filled 2014-12-03: qty 250

## 2014-12-03 MED ORDER — EPINEPHRINE HCL 1 MG/ML IJ SOLN
0.5000 ug/min | INTRAVENOUS | Status: DC
  Administered 2014-12-03: 10 ug/min via INTRAVENOUS
  Filled 2014-12-03 (×3): qty 4

## 2014-12-03 MED ORDER — SODIUM BICARBONATE 8.4 % IV SOLN
50.0000 meq | Freq: Once | INTRAVENOUS | Status: AC
  Administered 2014-12-03: 50 meq via INTRAVENOUS

## 2014-12-03 MED ORDER — SODIUM BICARBONATE 8.4 % IV SOLN
INTRAVENOUS | Status: AC
  Administered 2014-12-03: 50 meq via INTRAVENOUS
  Filled 2014-12-03: qty 100

## 2014-12-03 MED ORDER — SODIUM BICARBONATE 8.4 % IV SOLN
INTRAVENOUS | Status: AC
  Administered 2014-12-03: 50 meq via INTRAVENOUS
  Filled 2014-12-03: qty 50

## 2014-12-03 MED ORDER — NOREPINEPHRINE BITARTRATE 1 MG/ML IV SOLN
0.0000 ug/min | INTRAVENOUS | Status: DC
  Administered 2014-12-03: 150 ug/min via INTRAVENOUS
  Administered 2014-12-03: 80 ug/min via INTRAVENOUS
  Filled 2014-12-03 (×6): qty 16

## 2014-12-03 MED ORDER — FENTANYL CITRATE (PF) 100 MCG/2ML IJ SOLN: 100.0000 ug | Freq: Once | INTRAMUSCULAR | Status: DC

## 2014-12-03 MED ORDER — SODIUM BICARBONATE 8.4 % IV SOLN
50.0000 meq | Freq: Once | INTRAVENOUS | Status: DC
  Filled 2014-12-03: qty 50

## 2014-12-03 MED ORDER — EPINEPHRINE HCL 0.1 MG/ML IJ SOSY: 1.0000 mg | PREFILLED_SYRINGE | Freq: Once | INTRAMUSCULAR | Status: DC

## 2014-12-03 MED ORDER — MIDAZOLAM HCL 2 MG/2ML IJ SOLN: 2.0000 mg | Freq: Once | INTRAMUSCULAR | Status: DC

## 2014-12-03 MED ORDER — SODIUM CHLORIDE 0.9 % IV SOLN
0.0100 [IU]/min | INTRAVENOUS | Status: DC
  Administered 2014-12-03: 0.03 [IU]/min via INTRAVENOUS
  Filled 2014-12-03: qty 2

## 2014-12-03 MED ORDER — SODIUM BICARBONATE 8.4 % IV SOLN
100.0000 meq | Freq: Once | INTRAVENOUS | Status: AC
  Administered 2014-12-03: 100 meq via INTRAVENOUS

## 2014-12-03 MED FILL — Medication: Qty: 3 | Status: AC

## 2014-12-04 ENCOUNTER — Ambulatory Visit: Payer: Medicare PPO | Admitting: Urology

## 2014-12-04 LAB — BLOOD GAS, ARTERIAL
ACID-BASE DEFICIT: 16.2 mmol/L — AB (ref 0.0–2.0)
Bicarbonate: 14.8 mEq/L — ABNORMAL LOW (ref 21.0–28.0)
FIO2: 1
FIO2: 1
LHR: 18 {breaths}/min
MECHANICAL RATE: 18
MECHANICAL RATE: 18
MECHVT: 450 mL
O2 SAT: 80.2 %
PATIENT TEMPERATURE: 37
PCO2 ART: 56 mmHg — AB (ref 32.0–48.0)
PCO2 ART: 90 mmHg — AB (ref 32.0–48.0)
PEEP: 10 cmH2O
PEEP: 5 cmH2O
PH ART: 7.03 — AB (ref 7.350–7.450)
PO2 ART: 51 mmHg — AB (ref 83.0–108.0)
Patient temperature: 37
RATE: 18 resp/min
VT: 450 mL
pH, Arterial: 6.85 — CL (ref 7.350–7.450)
pO2, Arterial: 66 mmHg — ABNORMAL LOW (ref 83.0–108.0)

## 2014-12-05 LAB — CULTURE, BLOOD (ROUTINE X 2)

## 2014-12-05 LAB — URINE CULTURE
Culture: >=100000
Special Requests: NORMAL

## 2014-12-18 NOTE — Progress Notes (Signed)
Transported pt to CT and returned back to CCU on the ventilator without incident.

## 2014-12-18 NOTE — Procedures (Addendum)
Performed under emergent condition Indication: Post cardiac arrest, blood pressure monitoring, frequent lab draws Location: Right femoral artery  Patient prepped and draped in usual sterile fashion, right femoral pulse palpated, needle inserted without complication, pulsatile blood flow return. Utilizing a modified Seldinger technique catheter placed without complication, sutured in place by a biopatch  afixed Complications none

## 2014-12-18 NOTE — Telephone Encounter (Signed)
Patient's husband wanted to let you know that Barrett passed away this morning.  CB# [phone] [phone]

## 2014-12-18 NOTE — Progress Notes (Signed)
System down assessment performed at 7:00AM entered data at 9:00 due to system being down

## 2014-12-18 NOTE — Progress Notes (Signed)
   11/18/2014 0302  Clinical Encounter Type  Visited With Patient;Health care provider  Visit Type Initial;Code  Referral From Nurse  Consult/Referral To Chaplain  Spiritual Encounters  Spiritual Needs Emotional  Stress Factors  Patient Stress Factors Health changes  Chaplain received page to come to unit for a code blue. Chaplain provided support as applicable. Chaplain Cornelia Walraven A. Alleen Kehm Ext. [phone]

## 2014-12-18 NOTE — Progress Notes (Signed)
Pt arrived to unit from ed at 19:30. Pt on vent at 100%, hypotensive with map in 50's, temp 104F. Per orders, pt placed on cooling blanket, administered 1l bolus x 2, temp gradually decreased, at 2230 temp 98.4. Despite bolus bp continued to drop. Pt placed on Levo gtt per order. Pt initially responded to levo gtt, with map greater then 65. At 0000 pt taken to ct for head ct. Pt tolerated procedure well with no complications. 0100 pt bp began to drop, titrated  levo gtt accordingly at  0240 pt oxygen saturation dropped into the  80's, rr 38-42, levo gtt running at with bp continuing to drop, paged dr hower to inform him of situation. Dr. Clint Guy arrived on unit at pt bedside and pt brady down and went asystole.  Code Blue initiated. See code blue sheet for specifics. Regained pulse at 0302. Multiple verbal orders given per Dr. Clint Guy. See Mar. Dr Clint Guy emergently placed A-Line with no complications. Throughout remainder of shift pt continued to be hypotensive. Verbal orders given per Dr. Debara Pickett and Dr. Sheryle Hail. See Mar

## 2014-12-18 NOTE — Progress Notes (Signed)
Dr Wiliam Ke called this am arrived at 7:35. Patient declining, code initiated at 7:43 with MD present. Per Family requested to stop code being performed on patient. Dr Wiliam Ke present and stopped code.

## 2014-12-18 NOTE — Therapy (Signed)
Called to patient room by nurse. Code blue in progress. Patient found with CPR/ACLS in progress. Patient manaally ventilated until pulse regained. Placed back on vent at ordered settings. ABG obtained, critical values called to M.D. Patient subsequently made a DNR. Patient pronounced, vent discontinued, extubated. Family at bedside.

## 2014-12-18 NOTE — Progress Notes (Signed)
[facility] Physicians - [place] at [facility]   PATIENT NAME: [name] [name]    MR#:  [id number]  DATE OF BIRTH:  Jul 23, 1970  SUBJECTIVE:  CHIEF COMPLAINT:   Chief Complaint  Patient presents with  . Altered Mental Status   Mrs. [name] [name] is admitted for altered mental status and possible sepsis. She was intubated in the emergency room. Critically declining condition. Multiple episodes of hypotension and a cardiac arrest overnight requiring several pushes of bicarbonate and also epinephrine. This morning when I saw the patient at 7 AM, she has an arterial line showing low blood pressure with systolic in the 70s while she is already on levofloxacin at drip, vasopressin drip and epinephrine drip.  Remained anuric since admission. Pupils fixed and dilated and no neurologic response while off of sedation. Had another episode of hypotension followed by cardiac arrest, briefly was resuscitated but unable to maintain blood pressure and started having bleeding through ET tube and NG tube. Extensive discussion with family at bedside including with her husband and sister and other family members they have decided to make the patient DO NOT RESUSCITATE. No further escalation of care was done while she was maintained on all the pressors and bicarbonate. Her blood pressure dropped again and patient was pronounced at 8:43 AM.  REVIEW OF SYSTEMS:  Review of Systems  Unable to perform ROS: critical illness    DRUG ALLERGIES:   Allergies  Allergen Reactions  . Reglan [Metoclopramide] Shortness Of Breath  . Ibuprofen Itching and Nausea And Vomiting  . Lovenox [Enoxaparin Sodium] Other (See Comments)    Internal bleeding  . Medroxyprogesterone     Other reaction(s): Unknown  . Naproxen     Other reaction(s): Unknown    VITALS:  Blood pressure 102/70, pulse 94, temperature 99.6 F (37.6 C), temperature source Core (Comment), resp. rate 0, height 5\' 4"  (1.626 m), weight 60.7 kg  (133 lb 13.1 oz), SpO2 79 %.  PHYSICAL EXAMINATION:  Physical Exam  GENERAL:  44 y.o.-year-old patient lying in the bed with no acute distress.  EYES: Pupils are fixed and dilated, no reaction to light at this time. Corneal edema noted. Left subconjunctival hemorrhage noted. HEENT: Head atraumatic, normocephalic. Oropharynx and nasopharynx clear. Patient is orally intubated and bright red blood coming through her ET tube on cardiac compressions. NECK:  Supple, no jugular venous distention. No thyroid enlargement, no tenderness.  LUNGS: Coarse breath sounds bilaterally, no wheezing, rales,rhonchi or crepitation. Noticed use of accessory muscles of respiration.  CARDIOVASCULAR: S1, S2 normal. No murmurs, rubs, or gallops.  ABDOMEN: Soft, nontender, nondistended. Bowel sounds present. No organomegaly or mass.  EXTREMITIES: Cyanotic fingertips noted after chest compressions. Right lower extremity without any edema or swelling for dorsalis pedis pulses. Left lower extremity with the erythema from knee down her calf is swollen and her left foot is erythematous swollen and more fluctuant on day plantar side.  NEUROLOGIC: Patient is GCS- 3 not on any sedation. PSYCHIATRIC: The patient is alert and oriented x 3.  SKIN: Erythematous and swollen left foot and calf as noted above   LABORATORY PANEL:   CBC  Recent Labs Lab 11/27/2014 0425  WBC 10.1  HGB 7.6*  HCT 25.2*  PLT 169   ------------------------------------------------------------------------------------------------------------------  Chemistries   Recent Labs Lab 12-13-14 1529  11/23/2014 0830  NA 131*  < > 145  K 3.1*  < > 4.0  CL 89*  < > 100*  CO2 19*  < > 17*  GLUCOSE 375*  < >  299*  BUN 33*  < > 39*  CREATININE 1.94*  < > 2.53*  CALCIUM 7.7*  < > 6.7*  MG  --   --  1.9  AST 77*  --   --   ALT 20  --   --   ALKPHOS 73  --   --   BILITOT 3.2*  --   --   < > = values in this interval not  displayed. ------------------------------------------------------------------------------------------------------------------  Cardiac Enzymes  Recent Labs Lab 12/14/2014 0425  TROPONINI 0.42*   ------------------------------------------------------------------------------------------------------------------  RADIOLOGY:  Dg Chest 1 View  Dec 27, 2014   CLINICAL DATA:  Sepsis  EXAM: CHEST  1 VIEW  COMPARISON:  10/01/2014  FINDINGS: Low lung volumes are present, causing crowding of the pulmonary vasculature. Indistinct bandlike and nodular opacities along the left lung base. Suspected volume loss along the right hemidiaphragm.  Upper normal heart size. The patient is rotated to the right on today's radiograph, reducing diagnostic sensitivity and specificity. No blunting of the costophrenic angles.  IMPRESSION: 1. Bandlike in nodular opacities at the left lung base are nonspecific but could represent early or atypical infectious process. Followup radiography to ensure clearance is recommended. 2. Subsegmental atelectasis along the right hemidiaphragm.   Electronically Signed   By: Gaylyn Rong M.D.   On: 12-27-2014 16:07   Ct Head Wo Contrast  12/09/2014   CLINICAL DATA:  Patient unresponsive.  Fever.  Initial encounter.  EXAM: CT HEAD WITHOUT CONTRAST  TECHNIQUE: Contiguous axial images were obtained from the base of the skull through the vertex without intravenous contrast.  COMPARISON:  CT of the head performed 07/30/2013  FINDINGS: There is no evidence of acute infarction, mass lesion, or intra- or extra-axial hemorrhage on CT.  Chronic infarcts are noted at the corona radiata bilaterally, extending minimally into the right basal ganglia. The infarct on the right is new from the prior study. Mild periventricular white matter change likely reflects small vessel ischemic microangiopathy.  The posterior fossa, including the cerebellum, brainstem and fourth ventricle, is within normal limits. The  third and lateral ventricles are unremarkable in appearance. The cerebral hemispheres demonstrate grossly normal gray-white differentiation. No mass effect or midline shift is seen.  There is no evidence of fracture; visualized osseous structures are unremarkable in appearance. The orbits are within normal limits. The paranasal sinuses and mastoid air cells are well-aerated. No significant soft tissue abnormalities are seen.  IMPRESSION: 1. No acute intracranial pathology seen on CT. 2. 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 The infarct on the right is new from 2015, though it does appear chronic. 3. Mild small vessel ischemic microangiopathy.   Electronically Signed   By: Roanna Raider M.D.   On: 11/28/2014 03:06   Ct Foot Left Wo Contrast  December 27, 2014   CLINICAL DATA:  Soft tissue swelling about left foot callus, with tachypnea and tachycardia. Lactic acid of 7.3. Initial encounter.  EXAM: CT OF THE LEFT FOOT WITHOUT CONTRAST  TECHNIQUE: Multidetector CT imaging of the left foot was performed according to the standard protocol. Multiplanar CT image reconstructions were also generated.  COMPARISON:  Left foot radiographs performed 05/20/2013  FINDINGS: A prominent soft tissue fluid collection is noted along the plantar aspect of the forefoot, measuring approximately 5.7 x 3.1 x 1.8 cm, with surrounding soft tissue edema. This corresponds to the clinically described hematoma.  More diffuse soft tissue edema is noted extending about the entirety of the foot. No  additional focal abnormalities are characterized. Visualized flexor and extensor tendons are unremarkable in appearance. The peroneal tendons are unremarkable. The vasculature is not well assessed without contrast.  There is no evidence of fracture or dislocation. Visualized joint spaces are preserved. The sinus tarsi is unremarkable in appearance. No ankle joint effusion is identified. The  subtalar joint is unremarkable. The Achilles tendon remains intact.  IMPRESSION: 1. No evidence of fracture or dislocation. 2. Prominent soft tissue fluid collection along the plantar aspect of the forefoot, measuring 5.7 x 3.1 x 1.8 cm, with surrounding soft tissue edema. This corresponds to the clinically described hematoma. 3. More diffuse soft tissue edema noted about the entirety of the foot.   Electronically Signed   By: Roanna Raider M.D.   On: 12-12-2014 19:13   Dg Chest Port 1 View  11/18/2014   CLINICAL DATA:  Intubation  EXAM: PORTABLE CHEST - 1 VIEW  COMPARISON:  Chest x-ray from yesterday  FINDINGS: Endotracheal tube with tip at the clavicular heads. Orogastric tube with tip at least in the stomach.  Bilateral patchy lung opacity with a somewhat nodular appearance. Confluent opacity at the left base has rapidly progressed. No evidence effusion or cavitation. No air leak.  IMPRESSION: 1. Endotracheal and orogastric tubes remain in good position. 2. Rapid progression of airspace disease, especially in the left lower lobe, likely pneumonia.   Electronically Signed   By: Marnee Spring M.D.   On: 11/30/2014 06:52   Dg Chest Portable 1 View  Dec 12, 2014   CLINICAL DATA:  Post intubation  EXAM: PORTABLE CHEST - 1 VIEW  COMPARISON:  12/12/14  FINDINGS: Cardiomediastinal silhouette is stable. Endotracheal tube in place with tip 2 cm above the carina. NG tube in place. There is again noted streaky airspace disease bilateral lower lobe. Atypical infection cannot be excluded. No pneumothorax.  IMPRESSION: Endotracheal tube in place with tip 2 cm above the carina. NG tube in place. There is again noted streaky airspace disease bilateral lower lobe. Atypical infection cannot be excluded.   Electronically Signed   By: Natasha Mead M.D.   On: 12-12-14 16:51    EKG:   Orders placed or performed during the hospital encounter of Dec 12, 2014  . EKG 12-Lead  . EKG 12-Lead    ASSESSMENT AND PLAN:    44 year old female with past medical history significant for peripheral vascular disease, diabetes mellitus history of blood clots in the legs, coronary artery disease admitted for severe sepsis and multiorgan failure.  #1 severe sepsis-with septic shock. Blood cultures growing gram-positive cocci -Source could be her left leg that looks infected but no discharge noted. - Also chest x-ray showing pneumonia and there is also UTI. -Patient waited too long before she seek medical attention according to the family. -She was already on vancomycin and Zosyn. She was so critically ill that her pressure couldn't be maintained. -Surgery has been consulted to take a look at her like to see if incision and drainage might help however patient was too unstable and passed away.  #2 metabolic and lactic acidosis-required multiple pushes of bicarbonate, lactic acid was greater than 11 on admission. -Critically ill  #3 cardiac arrest-from septic shock and hypotension. Known history of coronary artery disease unknown ejection fraction.  #4 acute renal failure- 2 to ATN from severe sepsis. Nephrology has been consulted however was not stable enough to even consider CRRT.    #5 acute respiratory failure-intubated and was on vent. Chest x-ray with pneumonia. Was on 100% FiO2.  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 GCS of 3 and was not on any sedation. Pupils are fixed and dilated.  #7 acute on chronic anemia-partly dilutional and also was low at losing blood, could be from DIC.  #8 possible DIC-started bleeding during resuscitation was having OG and ET tube better blood. Hemoglobin dropped, DIC labs were supposed to be ordered however patient passed away.  #9 demand ischemia with elevated troponins-from severe sepsis  #10 diabetes mellitus-uncontrolled and elevated likely from sepsis.  #11 peripheral vascular disease-known PAD, now with hypotension  and being on pressors was starting to get cyanotic with poor peripheral perfusion. Extensive vascular disease requiring a procedure on her left leg as well.   Patient was critically ill to begin with. This very unstable all night requiring multiple doses of epinephrine and bicarbonate pushes. Went into cardiac arrest again this morning. After resuscitation briefly maintained her blood pressure. Due to her bleeding and critical condition family decided to make her DO NOT RESUSCITATE. Patient was pronounced dead at 8:43 AM Family was at bedside.  All the records are reviewed and case discussed with Care Management/Social Workerr. Management plans discussed with the patient, family and they are in agreement.  CODE STATUS: Full code initially.  TOTAL CRITICAL CARE TIME SPENT IN TAKING CARE OF THIS PATIENT: 90 minutes.    [name] M.D on 16-Dec-2014 at 10:19 AM  Between 7am to 6pm - Pager - [phone]  After 6pm go to [email or web address] - password [email or web address] [facility]  [place] [facility]  Office  [phone]  CC: Primary care physician; Lorie Phenix, MD

## 2014-12-18 NOTE — Progress Notes (Signed)
   2014/12/27 0447  Clinical Encounter Type  Visited With Patient;Family;Health care provider  Visit Type Follow-up  Referral From Nurse  Consult/Referral To Chaplain  Spiritual Encounters  Spiritual Needs Emotional  Stress Factors  Family Stress Factors Health changes  Chaplain was paged to return to the unit as family was present. Offered a compassionate presence and support as applicable. Chaplain Lester Crickenberger A. Kyo Cocuzza Ext. [phone]

## 2014-12-18 NOTE — Consult Note (Signed)
PHARMACY - CRITICAL CARE PROGRESS NOTE  Pharmacy Consult for electrolyte managment Indication:    Allergies  Allergen Reactions  . Reglan [Metoclopramide] Shortness Of Breath  . Ibuprofen Itching and Nausea And Vomiting  . Lovenox [Enoxaparin Sodium] Other (See Comments)    Internal bleeding  . Medroxyprogesterone     Other reaction(s): Unknown  . Naproxen     Other reaction(s): Unknown    Patient Measurements: Height: 5\' 4"  (162.6 cm) Weight: 133 lb 13.1 oz (60.7 kg) IBW/kg (Calculated) : 54.7 Adjusted Body Weight:   Vital Signs: Temp: 99.9 F (37.7 C) (08/17 0515) Temp Source: Core (Comment) (08/17 0000) BP: 68/41 mmHg (08/17 0515) Pulse Rate: 113 (08/17 0515) Intake/Output from previous day: 08/16 0701 - 08/17 0700 In: 1153.3 [I.V.:653.3; IV Piggyback:500] Out: 250 [Urine:250] Intake/Output from this shift: Total I/O In: 1153.3 [I.V.:653.3; IV Piggyback:500] Out: 250 [Urine:250] Vent settings for last 24 hours: Vent Mode:  [-] PRVC FiO2 (%):  [90 %-100 %] 100 % Set Rate:  [18 bmp] 18 bmp Vt Set:  [450 mL] 450 mL PEEP:  [5 cmH20-10 cmH20] 10 cmH20  Labs:  Recent Labs  22-Dec-2014 1529 11/26/2014 0425  WBC 5.2 10.1  HGB 9.6* 7.6*  HCT 30.9* 25.2*  PLT 79* PENDING  INR  --  1.94  CREATININE 1.94* 2.26*  ALBUMIN 2.2*  --   PROT 6.0*  --   AST 77*  --   ALT 20  --   ALKPHOS 73  --   BILITOT 3.2*  --    Estimated Creatinine Clearance: 28 mL/min (by C-G formula based on Cr of 2.26).   Recent Labs  Dec 22, 2014 2025 11/29/2014 0014  GLUCAP 311* 208*    Microbiology: Recent Results (from the past 720 hour(s))  CULTURE, URINE COMPREHENSIVE     Status: None   Collection Time: 11/10/14 12:27 PM  Result Value Ref Range Status   Urine Culture, Comprehensive Final report  Final   Result 1 Comment  Final    Comment: Mixed urogenital flora 10,000-25,000 colony forming units per mL   MRSA PCR Screening     Status: None   Collection Time: 2014/12/22  2:57 PM   Result Value Ref Range Status   MRSA by PCR NEGATIVE NEGATIVE Final    Comment:        The GeneXpert MRSA Assay (FDA approved for NASAL specimens only), is one component of a comprehensive MRSA colonization surveillance program. It is not intended to diagnose MRSA infection nor to guide or monitor treatment for MRSA infections.   Blood culture (routine x 2)     Status: None (Preliminary result)   Collection Time: 12/22/2014  3:29 PM  Result Value Ref Range Status   Specimen Description BLOOD RIGHT ASSIST CONTROL  Final   Special Requests BAA,10ML,ANA,AER  Final   Culture  Setup Time   Final    GRAM POSITIVE COCCI IN CLUSTERS IN BOTH AEROBIC AND ANAEROBIC BOTTLES CRITICAL RESULT CALLED TO, READ BACK BY AND VERIFIED WITH: MICHELLE WILLIAMS @0256  11/18/2014.Marland KitchenMarland KitchenAJO CONFIRMED BY [facility]    Culture PENDING  Incomplete   Report Status PENDING  Incomplete  Blood culture (routine x 2)     Status: None (Preliminary result)   Collection Time: 12/22/2014  3:29 PM  Result Value Ref Range Status   Specimen Description BLOOD LEFT ARM  Final   Special Requests BAA,5ML,ARE,ANA  Final   Culture  Setup Time   Final    GRAM POSITIVE COCCI IN CLUSTERS IN BOTH AEROBIC AND  ANAEROBIC BOTTLES CRITICAL VALUE NOTED.  VALUE IS CONSISTENT WITH PREVIOUSLY REPORTED AND CALLED VALUE. CONFIRMED BY [facility]    Culture PENDING  Incomplete   Report Status PENDING  Incomplete    Medications:  Scheduled:  . antiseptic oral rinse  7 mL Mouth Rinse Q2H while awake  . chlorhexidine gluconate  15 mL Mouth Rinse BID  . fentaNYL (SUBLIMAZE) injection  100 mcg Intravenous Once  . insulin aspart  0-15 Units Subcutaneous Q6H  . levothyroxine  200 mcg Per Tube QAC breakfast  . midazolam  2 mg Intravenous Once  . mometasone-formoterol  2 puff Inhalation BID  . piperacillin-tazobactam (ZOSYN)  IV  3.375 g Intravenous 3 times per day  . potassium chloride  10 mEq Intravenous Q1 Hr x 4  . tiotropium  1 capsule Inhalation Daily  .  vancomycin  750 mg Intravenous Q24H   Infusions:  . sodium chloride 150 mL/hr at 11/23/2014 2021  . epinephrine 15 mcg/min (04-Dec-2014 0521)  . fentaNYL infusion INTRAVENOUS Stopped (12/04/2014 0102)  . midazolam (VERSED) infusion 1 mg/hr (12-04-14 0240)  . norepinephrine (LEVOPHED) Adult infusion 100 mcg/min (12/04/2014 0521)  . pantoprozole (PROTONIX) infusion 8 mg/hr (11/20/2014 2031)  . vasopressin (PITRESSIN) infusion - *FOR SHOCK* 0.03 Units/min (2014-12-04 0440)    Assessment: Pt is a 44 year old female with sepsis, intubated and unresponsive. Pharmacy consulted to correct electrolytes. K=3.5, coded over night on several pressors.   Goal of Therapy:  Normalize electrolytes   Plan:  Give 40 meq of K over 4 hours (10Meq/hr) once. BMP, phos, mag at noon.  [name] [name], Pharm.D. Clinical Pharmacist Dec 04, 2014,5:26 AM

## 2014-12-18 NOTE — Discharge Summary (Addendum)
[name] - [place] at [facility]    Death Note   Death Note please see Last Note for all details.   In breif :    [name] [name] [id number] is a 44 y.o. female, Outpatient Primary MD for the patient is Lorie Phenix, MD  Pronounced dead by Dois Davenport, RN and Mellissa Kohut RN on   12-04-2014   @ 8:43AM                  Cause of death - septic shock and multiorgan failure  Total time spent on this patient (including running code blue and family meetings): approximately 90 minutes.   [name] M.D on 04-Dec-2014 at 10:37 AM  [facility] Phys[facility] - [place] at [facility]    OFFICE [phone]  Last Note:  [name] is admitted for altered mental status and possible sepsis. She was intubated in the emergency room.  44 year old female with past medical history significant for peripheral vascular disease, diabetes mellitus history of blood clots in the legs, coronary artery disease admitted for severe sepsis and multiorgan failure.  #1 severe sepsis-with septic shock. Blood cultures growing gram-positive cocci -Source could be her left leg that looks infected but no discharge noted. - Also chest x-ray showing pneumonia and there is also UTI. -Patient waited too long before she seek medical attention according to the family. -She was already on vancomycin and Zosyn. She was so critically ill that her pressure couldn't be maintained. -Surgery has been consulted to take a look at her like to see if incision and drainage might help however patient was too unstable and passed away.  #2 metabolic and lactic acidosis-required multiple pushes of bicarbonate, lactic acid was greater than 11 on admission. -Critically ill  #3 cardiac arrest-from septic shock and hypotension. Known history of coronary artery disease unknown ejection fraction.  #4 acute renal failure-2 to ATN from severe sepsis. Nephrology has been consulted  however was not stable enough to even consider CRRT.   #5 acute respiratory failure-intubated and was on vent. Chest x-ray with pneumonia. Was on 100% FiO2.  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 GCS of 3 and was not on any sedation. Pupils are fixed and dilated.  #7 acute on chronic anemia-partly dilutional and also was low at losing blood, could be from DIC.  #8 possible DIC-started bleeding during resuscitation was having OG and ET tube better blood. Hemoglobin dropped, DIC labs were supposed to be ordered however patient passed away.  #9 demand ischemia with elevated troponins-from severe sepsis  #10 diabetes mellitus-uncontrolled and elevated likely from sepsis.  #11 peripheral vascular disease-known PAD, now with hypotension and being on pressors was starting to get cyanotic with poor peripheral perfusion. Extensive vascular disease requiring a procedure on her left leg as well.   Patient was critically ill to begin with. This very unstable all night requiring multiple doses of epinephrine and bicarbonate pushes. Went into cardiac arrest again this morning. After resuscitation briefly maintained her blood pressure. Due to her bleeding and critical condition family decided to make her DO NOT RESUSCITATE. Patient was pronounced dead at 8:43 AM Family was at bedside.

## 2014-12-18 NOTE — Progress Notes (Signed)
   12-08-14 0914  Clinical Encounter Type  Visited With Patient and family together  Visit Type Follow-up  Consult/Referral To Chaplain  Spiritual Encounters  Spiritual Needs Emotional  Stress Factors  Patient Stress Factors None identified  Family Stress Factors Loss;Major life changes  Chaplain rounded back in the unit to check on patient and family. Patient had died. Supported the family and got the front desk intake specialists to order a bereavement tray for the family. Offered pastoral care and support until their pastor arrived. Chaplain Mehlani Blankenburg A. Salisa Broz Ext. [phone]

## 2014-12-18 NOTE — Progress Notes (Signed)
Called by nursing staff to assess patient and given hypotension and desaturation Patient evaluated at bedside by myself FiO2 100% PEEP of 5 saturating in the mid 80s, attempt lung recruiting maneuver and increase PEEP with transient response O2 saturations in the low 90s Patient subsequently bradyed down to the mid 50s and then went asystolic CODE BLUE called Epinephrine 2, bicarbonate 2 return to perfusing rhythm  Family called and updated on condition We'll check CBC, BMP concern for worsening lactic acidosis given the need for 2 pressors and previous lactic acid

## 2014-12-18 NOTE — Progress Notes (Signed)
Dr Nemiah Commander at patients bedside this am, patient coded, code inititated. Per families wishes made patient a DNR. Patient passed at 8:43. Pronounced by Cleda Clarks and Mellissa Kohut at 8:43. System down at that time. Per Dr Nemiah Commander Nurse may pronounce. Called [place] donor patient is not a candidate for donation. Called supervisor, family at bedside, MD present. Patient asystole, no pulse, no respirations, no heart sounds heard during auscultation. Patient post mortem care performed. Called orderly. SMB 11/19/2014

## 2014-12-18 DEATH — deceased

## 2015-11-25 NOTE — Telephone Encounter (Signed)
error 

## 2016-10-07 IMAGING — CR DG CHEST 2V
1 series · 2 of 2 positions shown · non-contrast
Comparison: 01/31/2014

CLINICAL DATA: Bronchitis. Cough. Gurgling sound when breathing for
2-3 days. Altered mental status. History of COPD.

EXAM:
CHEST  2 VIEW

[Series 1: dg chest 2 view · 0.14mm/px · 2 of 2 slices shown]
[im 1/2]
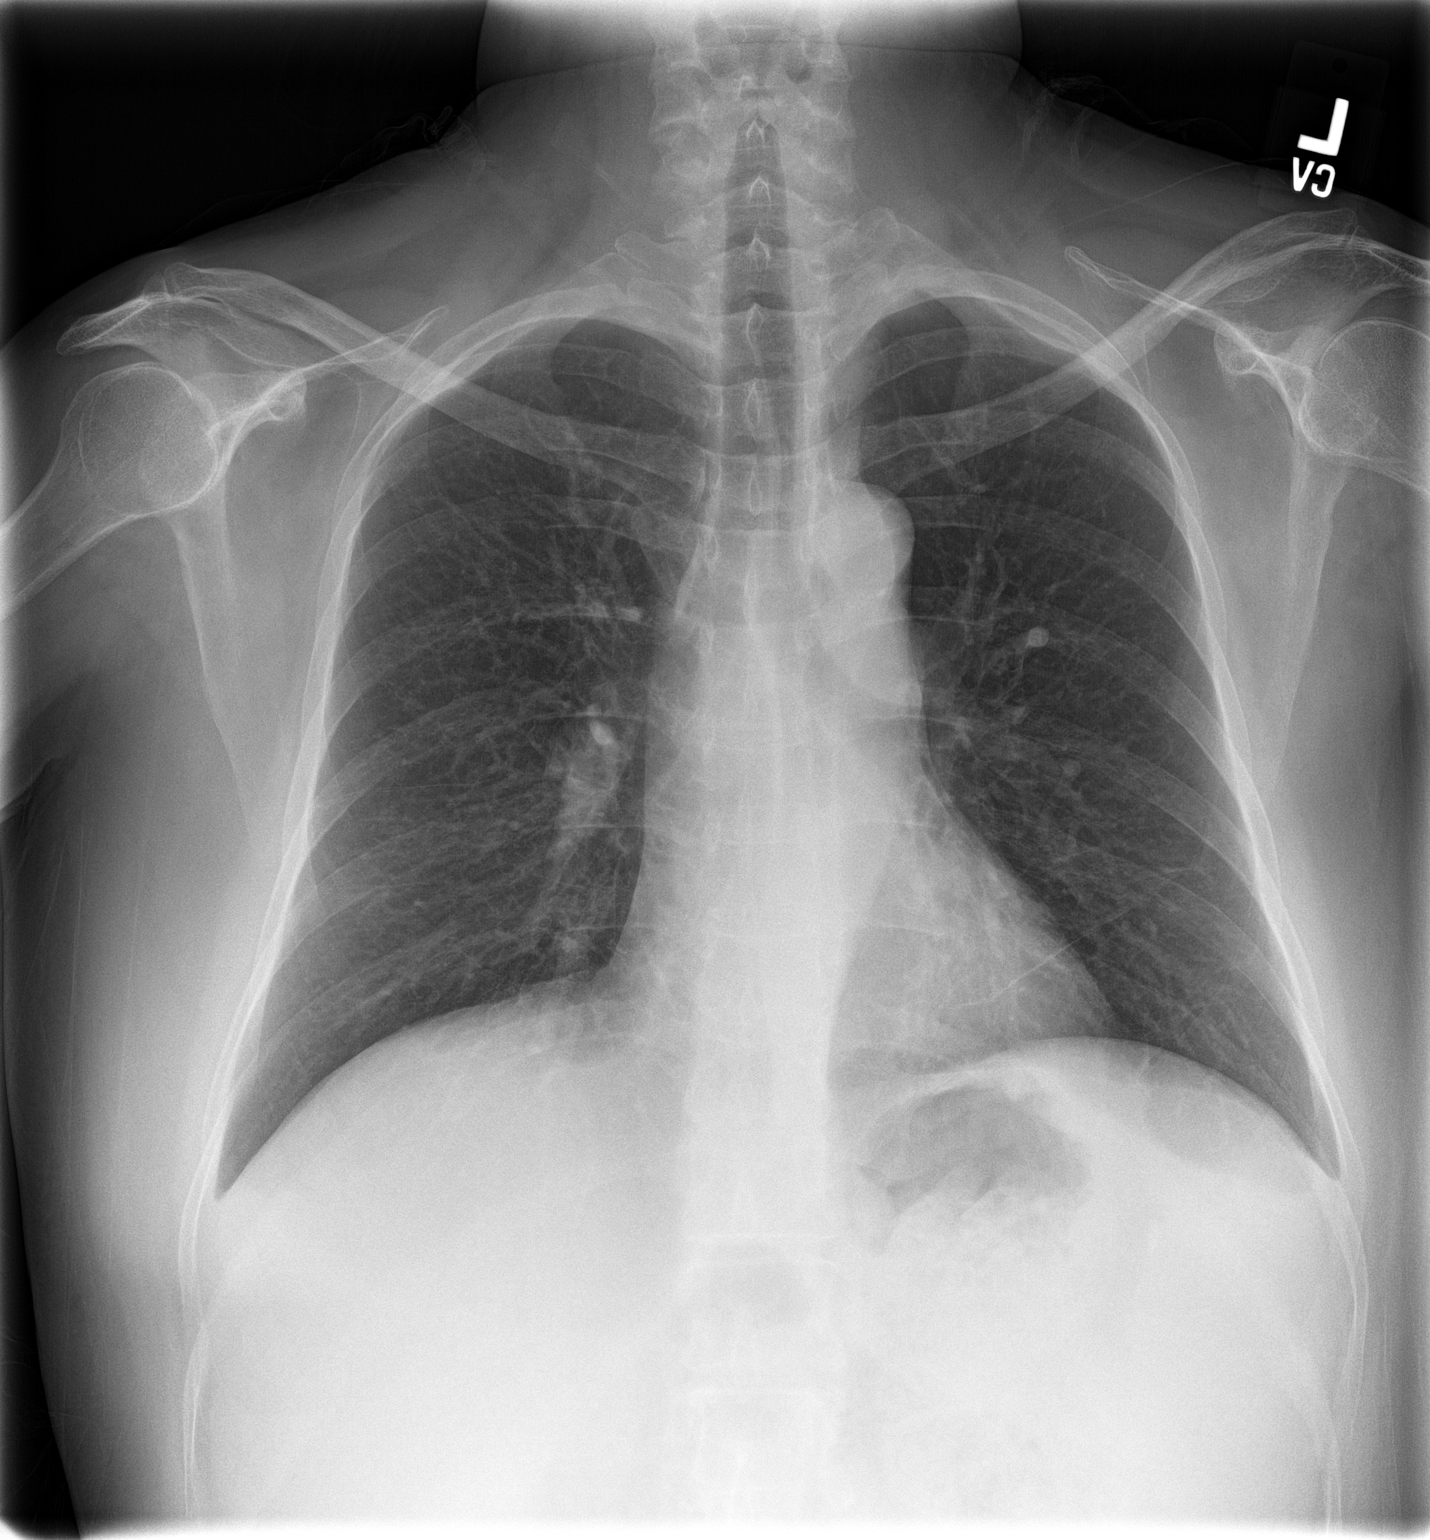
[im 2/2]
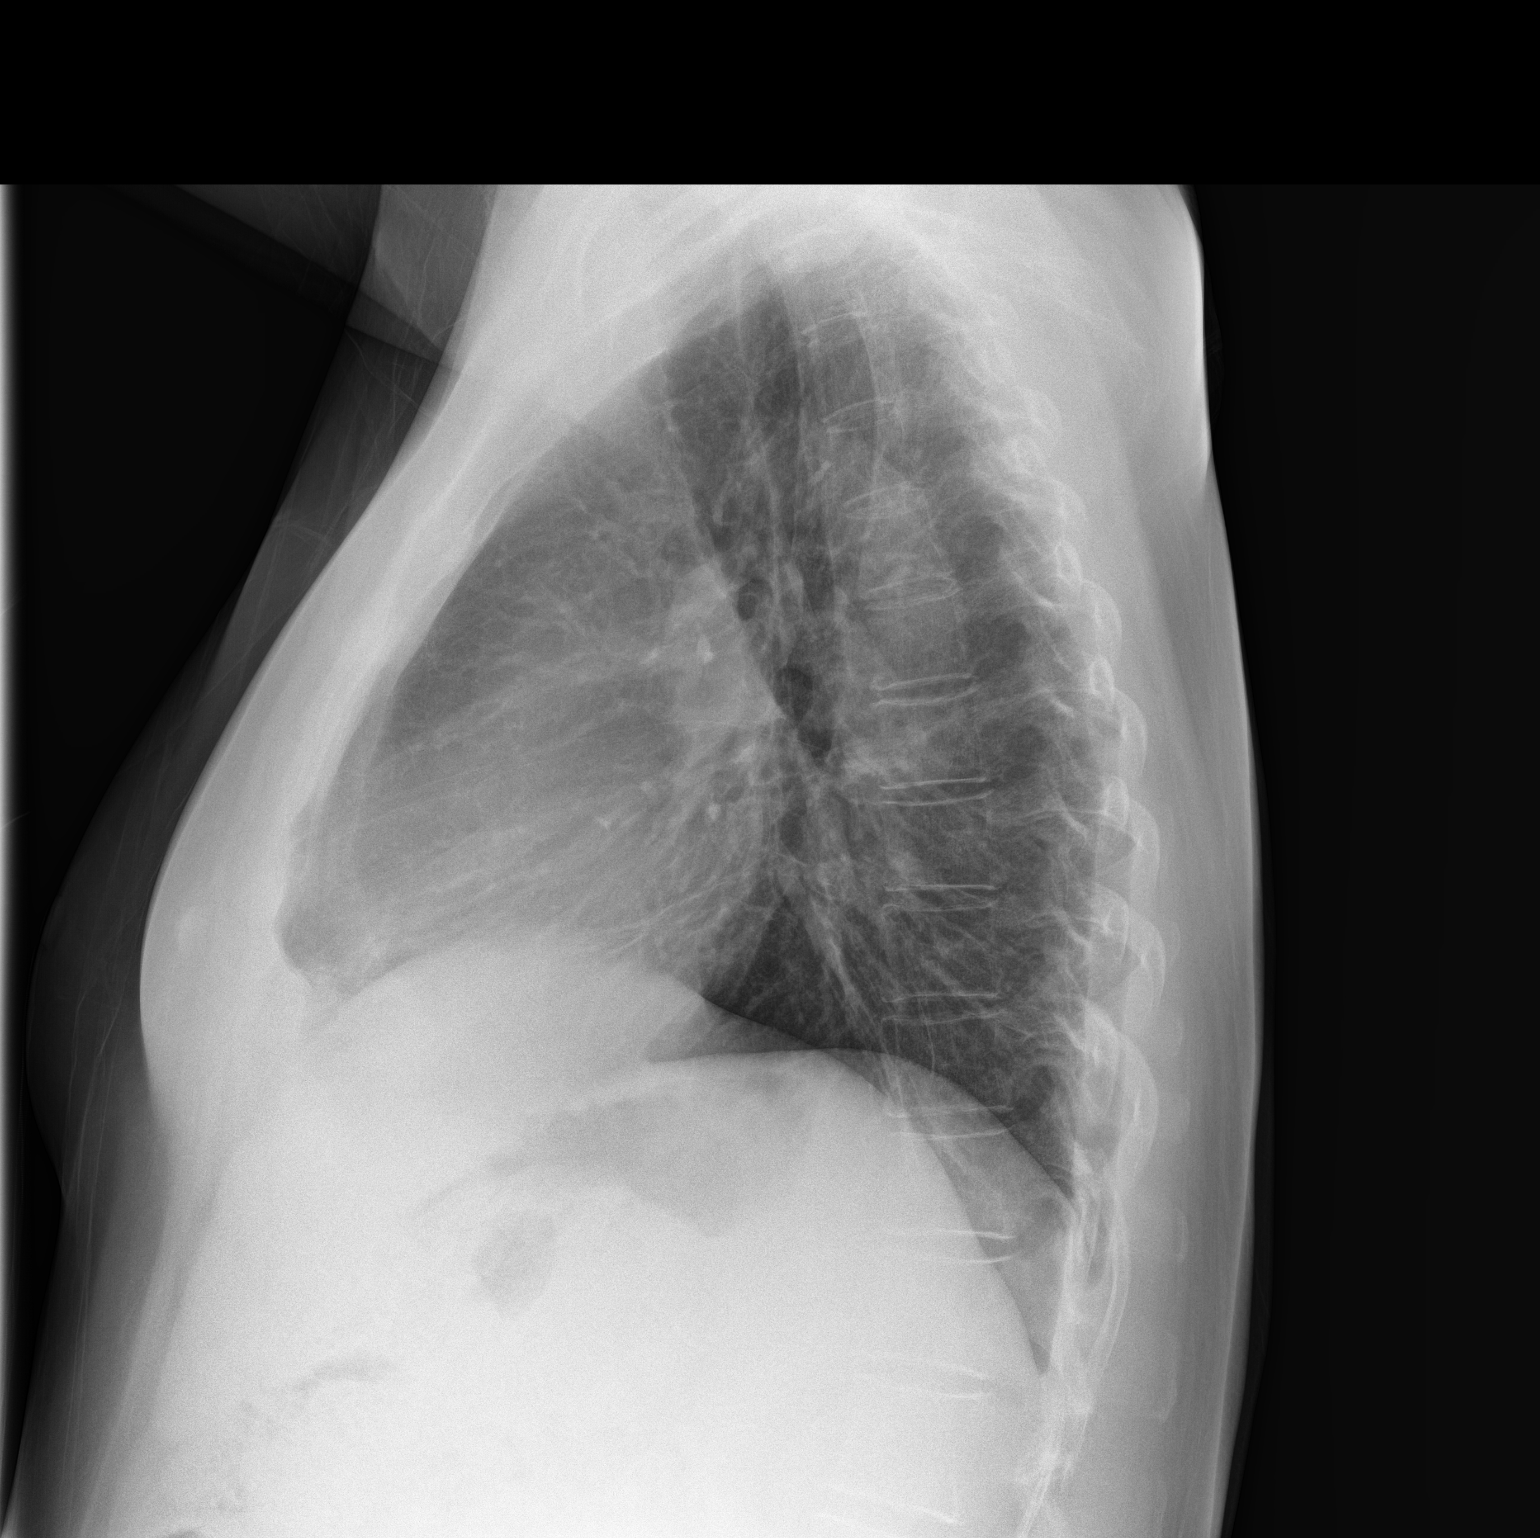

[2 of 2 positions shown; findings below may reference images not displayed]

FINDINGS: Cardiomediastinal silhouette is within normal limits. Lungs are well
inflated with mild chronic bronchitic changes again noted. Mild left
basilar scarring and/or subsegmental atelectasis is again seen.
There is no evidence of acute airspace consolidation, edema, pleural
effusion, or pneumothorax. No acute osseous abnormality is
identified.
IMPRESSION: No active cardiopulmonary disease.

## 2016-12-08 IMAGING — CT CT FOOT*L* W/O CM
2 series · 10 of 14 positions shown, 12 images · non-contrast
Comparison: Left foot radiographs performed 05/20/2013

CLINICAL DATA: Soft tissue swelling about left foot callus, with
tachypnea and tachycardia. Lactic acid of 7.3. Initial encounter.

EXAM:
CT OF THE LEFT FOOT WITHOUT CONTRAST
TECHNIQUE: Multidetector CT imaging of the left foot was performed according to
the standard protocol. Multiplanar CT image reconstructions were
also generated.

[Series 4: lt foot . · axial · 0.33mm/px · z∈[-11,+152]mm · 6 of 153 slices shown, 8 images]
[im 22/153  soft-tissue]
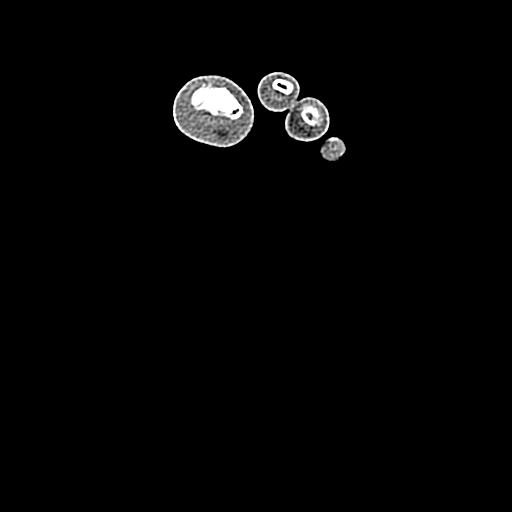
[im 22/153  bone]
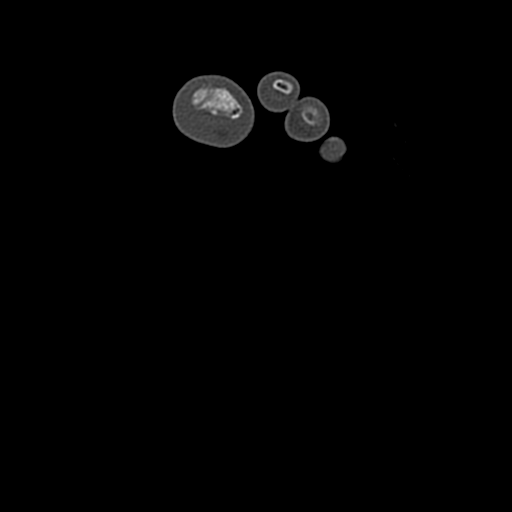
[im 44/153  bone]
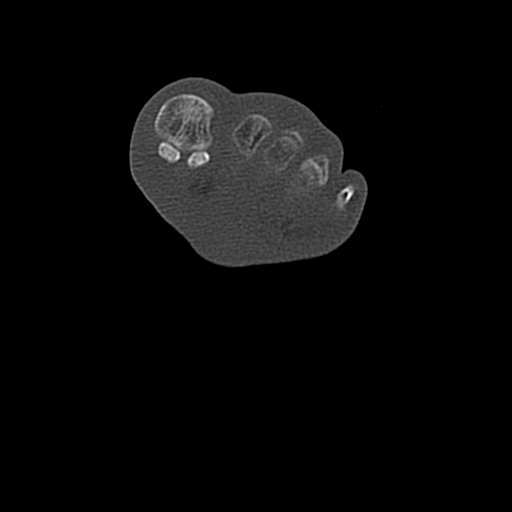
[im 66/153  bone]
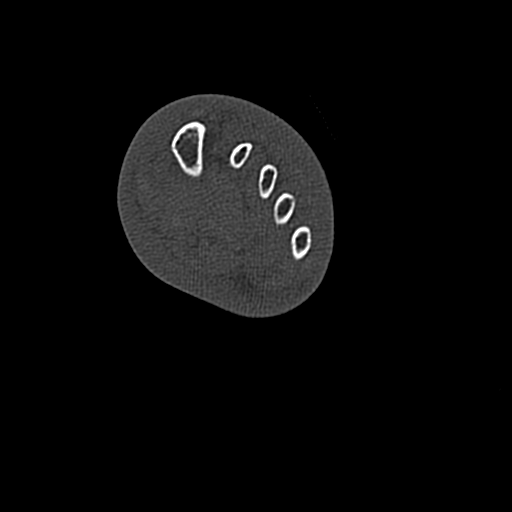
[im 87/153  bone]
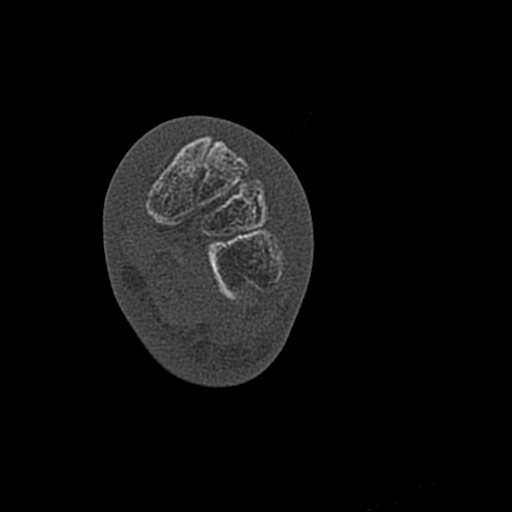
[im 109/153  soft-tissue]
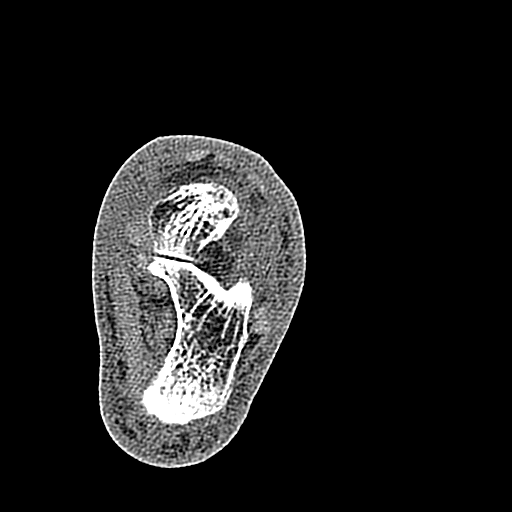
[im 109/153  bone]
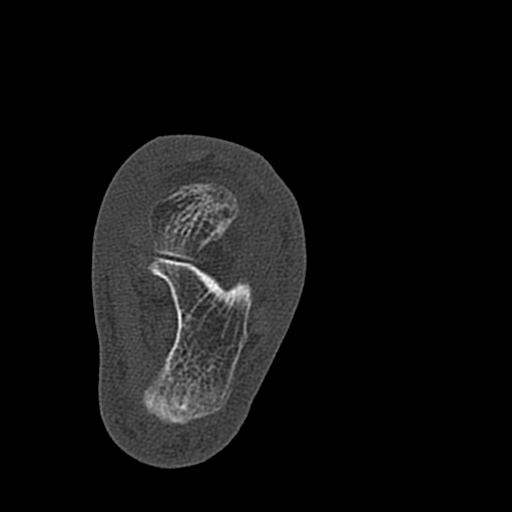
[im 131/153  bone]
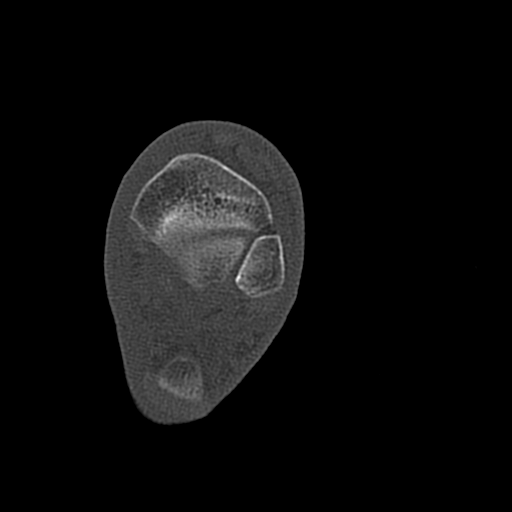

[Series 10: lt foot st axials 2 · axial · 0.30mm/px · z∈[+35,+160]mm · 4 of 113 slices shown]
[im 23/113  bone]
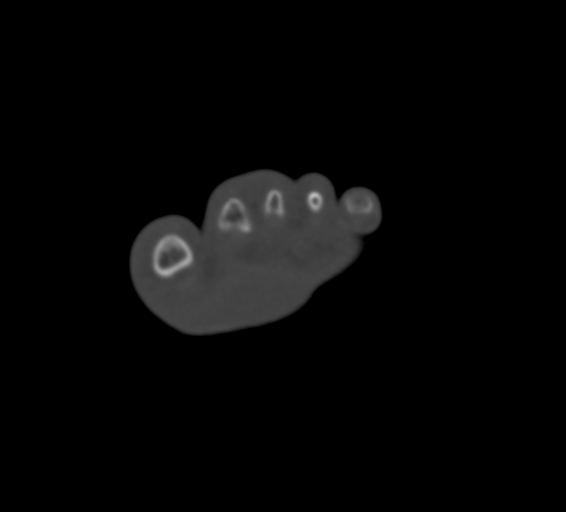
[im 45/113  bone]
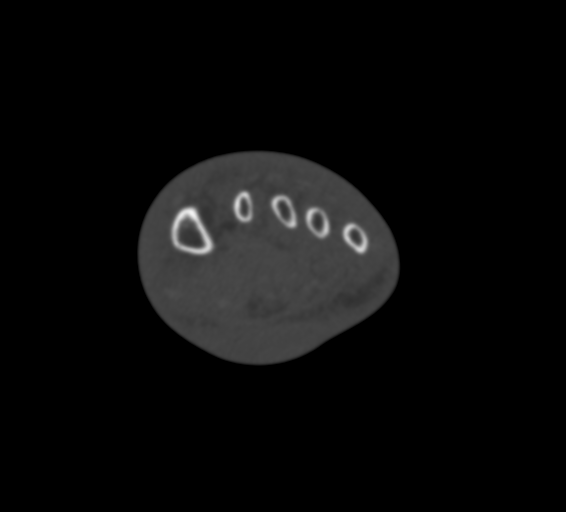
[im 68/113  bone]
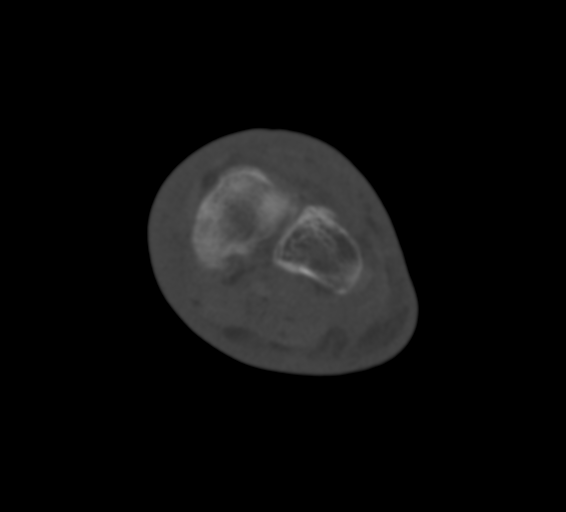
[im 90/113  bone]
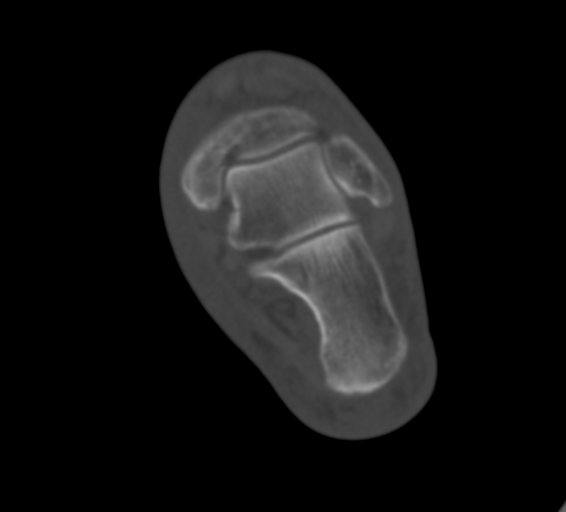

[10 of 14 positions shown; findings below may reference images not displayed]

FINDINGS: A prominent soft tissue fluid collection is noted along the plantar
aspect of the forefoot, measuring approximately 5.7 x 3.1 x 1.8 cm,
with surrounding soft tissue edema. This corresponds to the
clinically described hematoma.

More diffuse soft tissue edema is noted extending about the entirety
of the foot. No additional focal abnormalities are characterized.
Visualized flexor and extensor tendons are unremarkable in
appearance. The peroneal tendons are unremarkable. The vasculature
is not well assessed without contrast.

There is no evidence of fracture or dislocation. Visualized joint
spaces are preserved. The sinus tarsi is unremarkable in appearance.
No ankle joint effusion is identified. The subtalar joint is
unremarkable. The Achilles tendon remains intact.
IMPRESSION: 1. No evidence of fracture or dislocation.
2. Prominent soft tissue fluid collection along the plantar aspect
of the forefoot, measuring 5.7 x 3.1 x 1.8 cm, with surrounding soft
tissue edema. This corresponds to the clinically described hematoma.
3. More diffuse soft tissue edema noted about the entirety of the
foot.
